# Patient Record
Sex: Female | Born: 1950 | Race: Black or African American | Hispanic: No | Smoking: Former smoker
Health system: Southern US, Community
[De-identification: ages and names within clinical notes are randomized; demographics above are authoritative.]

## PROBLEM LIST (undated history)

## (undated) DIAGNOSIS — K219 Gastro-esophageal reflux disease without esophagitis: Secondary | ICD-10-CM

## (undated) DIAGNOSIS — M1990 Unspecified osteoarthritis, unspecified site: Secondary | ICD-10-CM

## (undated) DIAGNOSIS — E785 Hyperlipidemia, unspecified: Secondary | ICD-10-CM

## (undated) DIAGNOSIS — H269 Unspecified cataract: Secondary | ICD-10-CM

## (undated) HISTORY — DX: Hyperlipidemia, unspecified: E78.5

## (undated) HISTORY — DX: Unspecified osteoarthritis, unspecified site: M19.90

## (undated) HISTORY — DX: Gastro-esophageal reflux disease without esophagitis: K21.9

## (undated) HISTORY — PX: SHOULDER ARTHROSCOPY WITH ROTATOR CUFF REPAIR AND OPEN BICEPS TENODESIS: SHX6677

## (undated) HISTORY — DX: Unspecified cataract: H26.9

## (undated) HISTORY — PX: KNEE ARTHROPLASTY: SHX992

---

## 2010-01-15 DIAGNOSIS — Z86010 Personal history of colonic polyps: Secondary | ICD-10-CM | POA: Insufficient documentation

## 2010-11-29 DIAGNOSIS — N200 Calculus of kidney: Secondary | ICD-10-CM | POA: Insufficient documentation

## 2011-05-23 DIAGNOSIS — E785 Hyperlipidemia, unspecified: Secondary | ICD-10-CM | POA: Insufficient documentation

## 2011-08-13 DIAGNOSIS — I10 Essential (primary) hypertension: Secondary | ICD-10-CM | POA: Insufficient documentation

## 2012-02-19 DIAGNOSIS — M5030 Other cervical disc degeneration, unspecified cervical region: Secondary | ICD-10-CM | POA: Insufficient documentation

## 2012-06-01 DIAGNOSIS — A6000 Herpesviral infection of urogenital system, unspecified: Secondary | ICD-10-CM | POA: Insufficient documentation

## 2013-12-02 DIAGNOSIS — K5790 Diverticulosis of intestine, part unspecified, without perforation or abscess without bleeding: Secondary | ICD-10-CM | POA: Insufficient documentation

## 2018-01-09 DIAGNOSIS — F419 Anxiety disorder, unspecified: Secondary | ICD-10-CM | POA: Insufficient documentation

## 2018-03-13 DIAGNOSIS — Z87891 Personal history of nicotine dependence: Secondary | ICD-10-CM | POA: Insufficient documentation

## 2019-05-21 DIAGNOSIS — Z87891 Personal history of nicotine dependence: Secondary | ICD-10-CM | POA: Insufficient documentation

## 2019-07-12 DIAGNOSIS — R519 Headache, unspecified: Secondary | ICD-10-CM | POA: Insufficient documentation

## 2019-12-13 DIAGNOSIS — S92423B Displaced fracture of distal phalanx of unspecified great toe, initial encounter for open fracture: Secondary | ICD-10-CM | POA: Insufficient documentation

## 2019-12-13 DIAGNOSIS — S92413A Displaced fracture of proximal phalanx of unspecified great toe, initial encounter for closed fracture: Secondary | ICD-10-CM | POA: Insufficient documentation

## 2019-12-13 DIAGNOSIS — S92919A Unspecified fracture of unspecified toe(s), initial encounter for closed fracture: Secondary | ICD-10-CM | POA: Insufficient documentation

## 2019-12-22 ENCOUNTER — Emergency Department (HOSPITAL_COMMUNITY)
Admission: EM | Admit: 2019-12-22 | Discharge: 2019-12-23 | Disposition: A | Discharge status: Home / Self Care | Payer: Medicare Other | Attending: Emergency Medicine | Admitting: Emergency Medicine

## 2019-12-22 ENCOUNTER — Other Ambulatory Visit: Payer: Self-pay

## 2019-12-22 DIAGNOSIS — R195 Other fecal abnormalities: Secondary | ICD-10-CM

## 2019-12-22 DIAGNOSIS — R109 Unspecified abdominal pain: Secondary | ICD-10-CM

## 2019-12-22 DIAGNOSIS — K921 Melena: Secondary | ICD-10-CM | POA: Insufficient documentation

## 2019-12-22 LAB — COMPREHENSIVE METABOLIC PANEL
ALT: 17 U/L (ref 0–44)
AST: 15 U/L (ref 15–41)
Albumin: 4.2 g/dL (ref 3.5–5.0)
Alkaline Phosphatase: 79 U/L (ref 38–126)
Anion gap: 8 (ref 5–15)
BUN: 14 mg/dL (ref 8–23)
CO2: 29 mmol/L (ref 22–32)
Calcium: 10.5 mg/dL — ABNORMAL HIGH (ref 8.9–10.3)
Chloride: 105 mmol/L (ref 98–111)
Creatinine, Ser: 0.83 mg/dL (ref 0.44–1.00)
GFR calc Af Amer: >60 mL/min (ref >60)
GFR calc non Af Amer: >60 mL/min (ref >60)
Glucose, Bld: 147 mg/dL — ABNORMAL HIGH (ref 70–99)
Potassium: 3.8 mmol/L (ref 3.5–5.1)
Sodium: 142 mmol/L (ref 135–145)
Total Bilirubin: 0.9 mg/dL (ref 0.3–1.2)
Total Protein: 7.1 g/dL (ref 6.5–8.1)

## 2019-12-22 LAB — CBC
HCT: 37.1 % (ref 36.0–46.0)
Hemoglobin: 11.2 g/dL — ABNORMAL LOW (ref 12.0–15.0)
MCH: 28.4 pg (ref 26.0–34.0)
MCHC: 30.2 g/dL (ref 30.0–36.0)
MCV: 93.9 fL (ref 80.0–100.0)
Platelets: 270 10*3/uL (ref 150–400)
RBC: 3.95 MIL/uL (ref 3.87–5.11)
RDW: 13.9 % (ref 11.5–15.5)
WBC: 12.2 10*3/uL — ABNORMAL HIGH (ref 4.0–10.5)
nRBC: 0 % (ref 0.0–0.2)

## 2019-12-22 LAB — TYPE AND SCREEN
ABO/RH(D): B POS
Antibody Screen: NEGATIVE
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
## 2019-12-23 DIAGNOSIS — K921 Melena: Secondary | ICD-10-CM | POA: Diagnosis not present

## 2019-12-23 LAB — POC OCCULT BLOOD, ED: Fecal Occult Bld: NEGATIVE
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
## 2020-05-08 DIAGNOSIS — M7740 Metatarsalgia, unspecified foot: Secondary | ICD-10-CM | POA: Insufficient documentation

## 2020-05-08 DIAGNOSIS — M722 Plantar fascial fibromatosis: Secondary | ICD-10-CM | POA: Insufficient documentation

## 2020-05-08 DIAGNOSIS — M2190 Unspecified acquired deformity of unspecified limb: Secondary | ICD-10-CM | POA: Insufficient documentation

## 2020-05-20 DIAGNOSIS — M5030 Other cervical disc degeneration, unspecified cervical region: Secondary | ICD-10-CM | POA: Insufficient documentation

## 2020-05-20 DIAGNOSIS — K219 Gastro-esophageal reflux disease without esophagitis: Secondary | ICD-10-CM | POA: Insufficient documentation

## 2020-05-20 DIAGNOSIS — G4700 Insomnia, unspecified: Secondary | ICD-10-CM | POA: Insufficient documentation

## 2020-05-20 DIAGNOSIS — F329 Major depressive disorder, single episode, unspecified: Secondary | ICD-10-CM | POA: Insufficient documentation

## 2020-05-20 DIAGNOSIS — M48061 Spinal stenosis, lumbar region without neurogenic claudication: Secondary | ICD-10-CM | POA: Insufficient documentation

## 2020-05-20 DIAGNOSIS — M150 Primary generalized (osteo)arthritis: Secondary | ICD-10-CM | POA: Insufficient documentation

## 2020-05-20 DIAGNOSIS — M48062 Spinal stenosis, lumbar region with neurogenic claudication: Secondary | ICD-10-CM | POA: Insufficient documentation

## 2020-07-12 DIAGNOSIS — Z96611 Presence of right artificial shoulder joint: Secondary | ICD-10-CM | POA: Insufficient documentation

## 2020-07-12 DIAGNOSIS — Z471 Aftercare following joint replacement surgery: Secondary | ICD-10-CM | POA: Insufficient documentation

## 2020-07-20 ENCOUNTER — Other Ambulatory Visit: Payer: Self-pay | Admitting: Internal Medicine

## 2020-07-20 DIAGNOSIS — M7989 Other specified soft tissue disorders: Secondary | ICD-10-CM

## 2020-07-20 DIAGNOSIS — R1907 Generalized intra-abdominal and pelvic swelling, mass and lump: Secondary | ICD-10-CM

## 2020-07-25 ENCOUNTER — Other Ambulatory Visit: Payer: Self-pay

## 2020-07-25 ENCOUNTER — Ambulatory Visit
Admission: RE | Admit: 2020-07-25 | Discharge: 2020-07-25 | Disposition: A | Discharge status: Home / Self Care | Payer: Medicare Other | Source: Ambulatory Visit | Attending: Internal Medicine | Admitting: Internal Medicine

## 2020-07-25 DIAGNOSIS — M7989 Other specified soft tissue disorders: Secondary | ICD-10-CM | POA: Diagnosis present

## 2020-07-25 DIAGNOSIS — R1907 Generalized intra-abdominal and pelvic swelling, mass and lump: Secondary | ICD-10-CM | POA: Insufficient documentation

## 2020-09-11 ENCOUNTER — Other Ambulatory Visit: Payer: Self-pay | Admitting: Pain Medicine

## 2020-09-11 DIAGNOSIS — G90511 Complex regional pain syndrome I of right upper limb: Secondary | ICD-10-CM

## 2020-09-21 ENCOUNTER — Encounter
Admission: RE | Admit: 2020-09-21 | Discharge: 2020-09-21 | Disposition: A | Discharge status: Home / Self Care | Payer: Medicare Other | Source: Ambulatory Visit | Attending: Pain Medicine | Admitting: Pain Medicine

## 2020-09-21 ENCOUNTER — Ambulatory Visit
Admission: RE | Admit: 2020-09-21 | Discharge: 2020-09-21 | Disposition: A | Discharge status: Home / Self Care | Payer: Medicare Other | Source: Ambulatory Visit | Attending: Pain Medicine | Admitting: Pain Medicine

## 2020-09-21 ENCOUNTER — Other Ambulatory Visit: Payer: Self-pay

## 2020-09-21 DIAGNOSIS — G90511 Complex regional pain syndrome I of right upper limb: Secondary | ICD-10-CM | POA: Diagnosis present

## 2020-09-21 MED ORDER — TECHNETIUM TC 99M MEDRONATE IV KIT
20.0000 | PACK | Freq: Once | INTRAVENOUS | Status: AC | PRN
  Administered 2020-09-21: 22.33 via INTRAVENOUS

## 2020-09-22 DIAGNOSIS — G8929 Other chronic pain: Secondary | ICD-10-CM | POA: Insufficient documentation

## 2020-09-22 DIAGNOSIS — M25511 Pain in right shoulder: Secondary | ICD-10-CM | POA: Insufficient documentation

## 2020-10-09 DIAGNOSIS — S9031XA Contusion of right foot, initial encounter: Secondary | ICD-10-CM | POA: Insufficient documentation

## 2020-12-14 ENCOUNTER — Other Ambulatory Visit: Payer: Self-pay | Admitting: Orthopedic Surgery

## 2020-12-14 DIAGNOSIS — M25511 Pain in right shoulder: Secondary | ICD-10-CM

## 2021-01-02 ENCOUNTER — Other Ambulatory Visit: Payer: Self-pay

## 2021-01-02 ENCOUNTER — Ambulatory Visit
Admission: RE | Admit: 2021-01-02 | Discharge: 2021-01-02 | Disposition: A | Discharge status: Home / Self Care | Payer: Medicare Other | Source: Ambulatory Visit | Attending: Orthopedic Surgery | Admitting: Orthopedic Surgery

## 2021-01-02 DIAGNOSIS — D649 Anemia, unspecified: Secondary | ICD-10-CM | POA: Insufficient documentation

## 2021-01-02 DIAGNOSIS — M25511 Pain in right shoulder: Secondary | ICD-10-CM | POA: Diagnosis not present

## 2021-01-02 DIAGNOSIS — F1090 Alcohol use, unspecified, uncomplicated: Secondary | ICD-10-CM | POA: Insufficient documentation

## 2021-01-02 DIAGNOSIS — F329 Major depressive disorder, single episode, unspecified: Secondary | ICD-10-CM | POA: Insufficient documentation

## 2021-01-02 DIAGNOSIS — M545 Low back pain, unspecified: Secondary | ICD-10-CM | POA: Insufficient documentation

## 2021-01-02 DIAGNOSIS — Z96619 Presence of unspecified artificial shoulder joint: Secondary | ICD-10-CM | POA: Insufficient documentation

## 2021-01-02 DIAGNOSIS — M791 Myalgia, unspecified site: Secondary | ICD-10-CM | POA: Insufficient documentation

## 2021-01-02 DIAGNOSIS — F32A Depression, unspecified: Secondary | ICD-10-CM | POA: Insufficient documentation

## 2021-01-02 DIAGNOSIS — Z7289 Other problems related to lifestyle: Secondary | ICD-10-CM | POA: Insufficient documentation

## 2021-01-02 DIAGNOSIS — M12811 Other specific arthropathies, not elsewhere classified, right shoulder: Secondary | ICD-10-CM | POA: Insufficient documentation

## 2021-01-02 DIAGNOSIS — M75120 Complete rotator cuff tear or rupture of unspecified shoulder, not specified as traumatic: Secondary | ICD-10-CM | POA: Insufficient documentation

## 2021-01-02 DIAGNOSIS — M5430 Sciatica, unspecified side: Secondary | ICD-10-CM | POA: Insufficient documentation

## 2021-01-02 DIAGNOSIS — R7303 Prediabetes: Secondary | ICD-10-CM | POA: Insufficient documentation

## 2021-01-02 DIAGNOSIS — M25519 Pain in unspecified shoulder: Secondary | ICD-10-CM | POA: Insufficient documentation

## 2021-01-02 DIAGNOSIS — G8929 Other chronic pain: Secondary | ICD-10-CM | POA: Insufficient documentation

## 2021-01-09 ENCOUNTER — Other Ambulatory Visit: Payer: Self-pay | Admitting: Orthopedic Surgery

## 2021-01-09 DIAGNOSIS — M25519 Pain in unspecified shoulder: Secondary | ICD-10-CM

## 2021-02-05 ENCOUNTER — Other Ambulatory Visit: Payer: Self-pay

## 2021-02-05 ENCOUNTER — Encounter: Payer: Self-pay | Admitting: Student in an Organized Health Care Education/Training Program

## 2021-02-05 ENCOUNTER — Ambulatory Visit
Payer: Medicare Other | Attending: Student in an Organized Health Care Education/Training Program | Admitting: Student in an Organized Health Care Education/Training Program

## 2021-02-05 VITALS — BP 102/71 | HR 81 | Temp 97.0°F | Resp 16 | Ht 65.0 in | Wt 156.0 lb

## 2021-02-05 DIAGNOSIS — G5641 Causalgia of right upper limb: Secondary | ICD-10-CM | POA: Insufficient documentation

## 2021-02-05 DIAGNOSIS — G5690 Unspecified mononeuropathy of unspecified upper limb: Secondary | ICD-10-CM | POA: Insufficient documentation

## 2021-02-05 DIAGNOSIS — Z96611 Presence of right artificial shoulder joint: Secondary | ICD-10-CM | POA: Insufficient documentation

## 2021-02-05 DIAGNOSIS — G894 Chronic pain syndrome: Secondary | ICD-10-CM | POA: Diagnosis present

## 2021-02-05 DIAGNOSIS — M792 Neuralgia and neuritis, unspecified: Secondary | ICD-10-CM | POA: Diagnosis not present

## 2021-02-05 DIAGNOSIS — G5681 Other specified mononeuropathies of right upper limb: Secondary | ICD-10-CM | POA: Insufficient documentation
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
## 2021-02-05 NOTE — Progress Notes (Signed)
Safety precautions to be maintained throughout the outpatient stay will include: orient to surroundings, keep bed in low position, maintain call bell within reach at all times, provide assistance with transfer out of bed and ambulation.  
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
## 2021-02-09 LAB — COMPLIANCE DRUG ANALYSIS, UR

## 2021-02-12 NOTE — Progress Notes (Signed)
PROVIDER NOTE: Information contained herein reflects review and annotations entered in Brennan with encounter. Interpretation of such information and data should be left to medically-trained personnel. Information provided to patient can be located elsewhere in the medical record under "Patient Instructions". Document created using STT-dictation technology, any transcriptional errors that may result from process are unintentional.    Patient: Claudia Brennan  Service Category: Procedure  Provider: Oswaldo Done, MD  DOB: [birth year]  DOS: 02/13/2021  Location: [facility]  MRN: [id number]  Setting: Ambulatory - outpatient  Referring Provider: Roxana Hires, MD  Type: Established Patient  Specialty: Interventional Pain Management  PCP: Claudia Abrahams, MD   Primary Reason for Visit: Interventional Pain Management Treatment. CC: Shoulder Pain (right)    Procedure:          Anesthesia, Analgesia, Anxiolysis:  Type: Diagnostic Suprascapular nerve Block #1  Primary Purpose: Diagnostic Region: Posterior Shoulder & Scapular Areas Level: Superior to the scapular spine, in the lateral aspect of the supraspinatus fossa (Suprescapular notch). Target Area: Suprascapular nerve as it passes thru the lower portion of the suprascapular notch. Approach: Posterior percutaneous approach. Laterality: Right-Side  Type: Local Anesthesia Local Anesthetic: Lidocaine 1-2% Sedation: Oral Anxiolysis (Claudia Brennan) Indication(s): Anxiety & Analgesia Route: Infiltration (Claudia Brennan) IV Access: Available   Position: Prone   Indications: 1. Chronic shoulder pain (Right)   2. Complex regional pain syndrome type 2 of right upper extremity   3. Neuropathic pain of shoulder (Right)   4. Rotator cuff arthropathy of shoulder (Right)   5. Presence of artificial shoulder joint (Right)   6. S/P reverse arthroplasty of shoulder (Right)   7. Complex regional pain syndrome type 1 affecting shoulder  (Right)   8. Pain in shoulder region after shoulder replacement (Right)   9. Abnormal CT scan, cervical spine (Claudia Brennan) (11/26/2019)    Pain Score: Pre-procedure: 5 /10 Post-procedure: 7 /10     The patient was referred to Dr. Edward Brennan for treatment of a possible right shoulder complex regional pain syndrome type I status post shoulder replacement.  The patient had a single stellate ganglion block with documented benefit.  Upon examination of the patient the patient reveals shoulder pain that seems to be more localized towards the anterior aspect of the shoulder.  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  In addition the CT scan is also significant for a C3-4 anterolisthesis and multilevel bilateral cervical facet arthropathy, all of which could refer pain towards the shoulder region and may also temporarily respond to a right-sided stellate ganglion block with enough volume to spread over the affected areas.  Today the patient will undergo a diagnostic suprascapular nerve block to determine if her condition may be amenable/responsive to a right-sided suprascapular nerve radiofrequency ablation.  Pre-op H&P Assessment:  Claudia Brennan is a 70 y.o. (year old), female patient, seen today for interventional treatment. She  has a past surgical history that includes Knee Arthroplasty and Shoulder arthroscopy with rotator cuff repair and open biceps tenodesis (Bilateral). Claudia Brennan has a current medication list which includes the following prescription(s): atorvastatin, citalopram, hydrochlorothiazide, niacin, pantoprazole, pregabalin, topiramate, and trazodone, and the following Facility-Administered Medications: pentafluoroprop-tetrafluoroeth. Her primarily concern today is the Shoulder Pain (right)  Initial Vital Signs:  Pulse/HCG Rate: 63ECG Heart Rate: 67 Temp: (!) 97.1 F (36.2 C) Resp: 16 BP:  108/70 SpO2: 98 %  BMI: Estimated body mass index is 25.96  kg/m as calculated from the following:   Height as of this encounter: 5\' 5"  (1.651 m).   Weight as of this encounter: 156 lb (70.8 kg).  Risk Assessment: Allergies: Reviewed. She is allergic to penicillins.  Allergy Precautions: None required Coagulopathies: Reviewed. None identified.  Blood-thinner therapy: None at this time Active Infection(s): Reviewed. None identified. Claudia Brennan is afebrile  Site Confirmation: Claudia Brennan was asked to confirm the procedure and laterality before marking the site Procedure checklist: Completed Consent: Before the procedure and under the influence of no sedative(s), amnesic(s), or anxiolytics, the patient was informed of the treatment options, risks and possible complications. To fulfill our ethical and legal obligations, as recommended by the Claudia Brennan's Code of Ethics, I have informed the patient of my clinical impression; the nature and purpose of the treatment or procedure; the risks, benefits, and possible complications of the intervention; the alternatives, including doing nothing; the risk(s) and benefit(s) of the alternative treatment(s) or procedure(s); and the risk(s) and benefit(s) of doing nothing. The patient was provided information about the general risks and possible complications associated with the procedure. These may include, but are not limited to: failure to achieve desired goals, infection, bleeding, organ or nerve damage, allergic reactions, paralysis, and death. In addition, the patient was informed of those risks and complications associated to the procedure, such as failure to decrease pain; infection; bleeding; organ or nerve damage with subsequent damage to sensory, motor, and/or autonomic systems, resulting in permanent pain, numbness, and/or weakness of one or several areas of the body; allergic reactions; (i.e.: anaphylactic reaction); and/or  death. Furthermore, the patient was informed of those risks and complications associated with the medications. These include, but are not limited to: allergic reactions (i.e.: anaphylactic or anaphylactoid reaction(s)); adrenal axis suppression; blood sugar elevation that in diabetics may result in ketoacidosis or comma; water retention that in patients with history of congestive heart failure may result in shortness of breath, pulmonary edema, and decompensation with resultant heart failure; weight gain; swelling or edema; medication-induced neural toxicity; particulate matter embolism and blood vessel occlusion with resultant organ, and/or nervous system infarction; and/or aseptic necrosis of one or more joints. Finally, the patient was informed that Medicine is not an exact science; therefore, there is also the possibility of unforeseen or unpredictable risks and/or possible complications that may result in a catastrophic outcome. The patient indicated having understood very clearly. We have given the patient no guarantees and we have made no promises. Enough time was given to the patient to ask questions, all of which were answered to the patient's satisfaction. Ms. Langham has indicated that she wanted to continue with the procedure. Attestation: I, the ordering provider, attest that I have discussed with the patient the benefits, risks, side-effects, alternatives, likelihood of achieving goals, and potential problems during recovery for the procedure that I have provided informed consent. Date  Time: 02/13/2021  8:05 AM  Pre-Procedure Preparation:  Monitoring: As per clinic protocol. Respiration, ETCO2, SpO2, BP, heart rate and rhythm monitor placed and checked for adequate function Safety Precautions: Patient was assessed for positional comfort and pressure points before starting the procedure. Time-out: I initiated and conducted the "Time-out" before starting the procedure, as per protocol. The  patient was asked to participate by confirming the accuracy of the "Time Out" information. Verification of the correct person, site, and procedure were performed and confirmed by me, the nursing staff, and the patient. "Time-out" conducted as per Joint Commission's Universal Protocol (UP.01.01.01). Time: [id number]  Description of Procedure:          Area Prepped: Entire shoulder Area DuraPrep (Iodine Povacrylex [0.7% available iodine] and Isopropyl Alcohol, 74% w/w) Safety Precautions: Aspiration looking for blood return was conducted prior to all injections. At no point did we inject any substances, as a needle was being advanced. No attempts were made at seeking any paresthesias. Safe injection practices and needle disposal techniques used. Medications properly checked for expiration dates. SDV (single dose vial) medications used. Description of the Procedure: Protocol guidelines were followed. The patient was placed in position over the procedure table. The target area was identified and the area prepped in the usual manner. Skin & deeper tissues infiltrated with local anesthetic. Appropriate amount of time allowed to pass for local anesthetics to take effect. The procedure needles were then advanced to the target area. Proper needle placement secured. Negative aspiration confirmed. Solution injected in intermittent fashion, asking for systemic symptoms every 0.5cc of injectate. The needles were then removed and the area cleansed, making sure to leave some of the prepping solution back to take advantage of its long term bactericidal properties.  Vitals:   02/13/21 0804 02/13/21 0840 02/13/21 0845 02/13/21 0849  BP: 108/70 133/60 100/61 (!) 111/59  Pulse: 63     Resp: 16 15 14 14   Temp: (!) 97.1 F (36.2 C)     TempSrc: Temporal     SpO2: 98% 99% 100% 100%  Weight: 156 lb (70.8 kg)     Height: 5\' 5"  (1.651 m)       Start Time: 0839 hrs. End Time: 0848 hrs. Materials:  Needle(s) Type: Spinal  Needle Gauge: 22G Length: 3.5-in Medication(s): Please see orders for medications and dosing details.  Imaging Guidance (Non-Spinal):          Type of Imaging Technique: Fluoroscopy Guidance (Non-Spinal) Indication(s): Assistance in needle guidance and placement for procedures requiring needle placement in or near specific anatomical locations not easily accessible without such assistance. Exposure Time: Please see nurses notes. Contrast: Before injecting any contrast, we confirmed that the patient did not have an allergy to iodine, shellfish, or radiological contrast. Once satisfactory needle placement was completed at the desired level, radiological contrast was injected. Contrast injected under live fluoroscopy. No contrast complications. See chart for type and volume of contrast used. Fluoroscopic Guidance: I was personally present during the use of fluoroscopy. "Tunnel Vision Technique" used to obtain the best possible view of the target area. Parallax error corrected before commencing the procedure. "Direction-depth-direction" technique used to introduce the needle under continuous pulsed fluoroscopy. Once target was reached, antero-posterior, oblique, and lateral fluoroscopic projection used confirm needle placement in all planes. Images permanently stored in EMR. Interpretation: I personally interpreted the imaging intraoperatively. Adequate needle placement confirmed in multiple planes. Appropriate spread of contrast into desired area was observed. No evidence of afferent or efferent intravascular uptake. Permanent images saved into the patient's record.  Antibiotic Prophylaxis:   Anti-infectives (From admission, onward)    None      Indication(s): None identified  Post-operative Assessment:  Post-procedure Vital Signs:  Pulse/HCG Rate: 6360 Temp: (!) 97.1 F (36.2 C) Resp: 14 BP: (!) 111/59 SpO2: 100 %  EBL: None  Complications: No immediate post-treatment complications  observed by team, or reported by patient.  Note: The patient tolerated the entire procedure well. A repeat set of vitals were taken after the procedure and the patient was kept under observation following institutional policy, for this type of procedure. Post-procedural neurological assessment  was performed, showing return to baseline, prior to discharge. The patient was provided with post-procedure discharge instructions, including a section on how to identify potential problems. Should any problems arise concerning this procedure, the patient was given instructions to immediately contact us, at any time, without hesitation. In any case, we plan to contact the patient by telephone for a follow-up status report regarding this interventional procedure.  Comments:  No additional relevant information.  Plan of Care  Orders:  Orders Placed This Encounter  Procedures   SUPRASCAPULAR NERVE BLOCK    For shoulder pain.    Scheduling Instructions:     Purpose: Diagnostic     Laterality: Right-sided     Level(s): Suprascapular notch     Sedation: No Sedation.     Scheduling Timeframe: Today    Order Specific Question:   Where will this procedure be performed?    Answer:   [facility]   DG PAIN [facility] C-ARM 1-60 MIN NO REPORT    Intraoperative interpretation by procedural physician at [facility].    Standing Status:   Standing    Number of Occurrences:   1    Order Specific Question:   Reason for exam:    Answer:   Assistance in needle guidance and placement for procedures requiring needle placement in or near specific anatomical locations not easily accessible without such assistance.   Informed Consent Details: Physician/Practitioner Attestation; Transcribe to consent form and obtain patient signature    Note: Always confirm laterality of pain with Ms. Rainey, before procedure.    Order Specific Question:   Physician/Practitioner attestation of informed consent for  procedure/surgical case    Answer:   I, the physician/practitioner, attest that I have discussed with the patient the benefits, risks, side effects, alternatives, likelihood of achieving goals and potential problems during recovery for the procedure that I have provided informed consent.    Order Specific Question:   Procedure    Answer:   Suprascapular Nerve Block    Order Specific Question:   Physician/Practitioner performing the procedure    Answer:   Perian Tedder A. Laban Emperor, MD    Order Specific Question:   Indication/Reason    Answer:   Chronic shoulder pain (arthralgia)   Provide equipment / supplies at bedside    "Block Tray" (Disposable  single use) Needle type: SpinalSpinal Amount/quantity: 1 Size: Regular (3.5-inch) Gauge: 22G    Standing Status:   Standing    Number of Occurrences:   1    Order Specific Question:   Specify    Answer:   Block Tray    Chronic Opioid Analgesic:  No chronic opioid analgesics therapy prescribed by our practice.   Medications ordered for procedure: Meds ordered this encounter  Medications   lidocaine (XYLOCAINE) 2 % (with pres) injection 400 mg   pentafluoroprop-tetrafluoroeth (GEBAUERS) aerosol   ropivacaine (PF) 2 mg/mL (0.2%) (NAROPIN) injection 4 mL   methylPREDNISolone acetate (DEPO-MEDROL) injection 80 mg   diazepam (Claudia Brennan) tablet 5 mg    Make sure Flumazenil is available in the pyxis when using this medication. If oversedation occurs, administer 0.2 mg IV over 15 sec. If after 45 sec no response, administer 0.2 mg again over 1 min; may repeat at 1 min intervals; not to exceed 4 doses (1 mg)    Medications administered: We administered lidocaine, ropivacaine (PF) 2 mg/mL (0.2%), methylPREDNISolone acetate, and diazepam.  See the medical record for exact dosing, route, and time of administration.  Follow-up plan:  Return in about 2 weeks (around 02/27/2021) for (F2F), (MM) with Dr. Edward Brennan.        Interventional Therapies   Risk  Complexity Considerations:   Estimated body mass index is 25.96 kg/m as calculated from the following:   Height as of this encounter: 5\' 5"  (1.651 m).   Weight as of this encounter: 156 lb (70.8 kg). WNL   Planned  Pending:   Diagnostic right suprascapular NB #1 (02/13/2021)    Under consideration:   Possible right suprascapular nerve RFA  Diagnostic right stellate ganglion block  Possible right posterior thoracic sympathectomy under CT guidance  Diagnostic right cervical facet MBB  Possible right cervical facet RFA  Diagnostic right cervical ESI    Completed:   None at this time   Therapeutic  Palliative (PRN) options:   None established    Recent Visits Date Type Provider Dept  02/05/21 Office Visit 02/07/21, MD [facility]  Showing recent visits within past 90 days and meeting all other requirements Today's Visits Date Type Provider Dept  02/13/21 Procedure visit 02/15/21, MD [facility]  Showing today's visits and meeting all other requirements Future Appointments Date Type Provider Dept  03/01/21 Appointment 03/03/21, MD [facility]  Showing future appointments within next 90 days and meeting all other requirements Disposition: Discharge home  Discharge (Date  Time): 02/13/2021; 0900 hrs.   Primary Care Physician: 02/15/2021, Annia Belt, MD Location: [facility] Note by: [facility], MD Date: 02/13/2021; Time: 11:37 AM  Disclaimer:  Medicine is not an exact science. The only guarantee in medicine is that nothing is guaranteed. It is important to note that the decision to proceed with this intervention was based on the information collected from the patient. The Data and conclusions were drawn from the patient's questionnaire, the interview, and the physical examination. Because the information was provided in large part by the patient, it cannot be guaranteed that it has not  been purposely or unconsciously manipulated. Every effort has been made to obtain as much relevant data as possible for this evaluation. It is important to note that the conclusions that lead to this procedure are derived in large part from the available data. Always take into account that the treatment will also be dependent on availability of resources and existing treatment guidelines, considered by other Pain Management Practitioners as being common knowledge and practice, at the time of the intervention. For Medico-Legal purposes, it is also important to point out that variation in procedural techniques and pharmacological choices are the acceptable norm. The indications, contraindications, technique, and results of the above procedure should only be interpreted and judged by a Board-Certified Interventional Pain Specialist with extensive familiarity and expertise in the same exact procedure and technique.

## 2021-02-13 ENCOUNTER — Ambulatory Visit
Admission: RE | Admit: 2021-02-13 | Discharge: 2021-02-13 | Disposition: A | Discharge status: Home / Self Care | Payer: Medicare Other | Source: Ambulatory Visit | Attending: Pain Medicine | Admitting: Pain Medicine

## 2021-02-13 ENCOUNTER — Other Ambulatory Visit: Payer: Self-pay

## 2021-02-13 ENCOUNTER — Ambulatory Visit (HOSPITAL_BASED_OUTPATIENT_CLINIC_OR_DEPARTMENT_OTHER): Payer: Medicare Other | Admitting: Pain Medicine

## 2021-02-13 ENCOUNTER — Encounter: Payer: Self-pay | Admitting: Pain Medicine

## 2021-02-13 VITALS — BP 111/59 | HR 63 | Temp 97.1°F | Resp 14 | Ht 65.0 in | Wt 156.0 lb

## 2021-02-13 DIAGNOSIS — M25519 Pain in unspecified shoulder: Secondary | ICD-10-CM | POA: Diagnosis present

## 2021-02-13 DIAGNOSIS — M25619 Stiffness of unspecified shoulder, not elsewhere classified: Secondary | ICD-10-CM | POA: Insufficient documentation

## 2021-02-13 DIAGNOSIS — M1710 Unilateral primary osteoarthritis, unspecified knee: Secondary | ICD-10-CM | POA: Insufficient documentation

## 2021-02-13 DIAGNOSIS — F419 Anxiety disorder, unspecified: Secondary | ICD-10-CM

## 2021-02-13 DIAGNOSIS — Z96619 Presence of unspecified artificial shoulder joint: Secondary | ICD-10-CM

## 2021-02-13 DIAGNOSIS — M5412 Radiculopathy, cervical region: Secondary | ICD-10-CM | POA: Insufficient documentation

## 2021-02-13 DIAGNOSIS — Z96611 Presence of right artificial shoulder joint: Secondary | ICD-10-CM | POA: Insufficient documentation

## 2021-02-13 DIAGNOSIS — M12811 Other specific arthropathies, not elsewhere classified, right shoulder: Secondary | ICD-10-CM

## 2021-02-13 DIAGNOSIS — M19049 Primary osteoarthritis, unspecified hand: Secondary | ICD-10-CM | POA: Insufficient documentation

## 2021-02-13 DIAGNOSIS — M47812 Spondylosis without myelopathy or radiculopathy, cervical region: Secondary | ICD-10-CM | POA: Insufficient documentation

## 2021-02-13 DIAGNOSIS — M19011 Primary osteoarthritis, right shoulder: Secondary | ICD-10-CM | POA: Insufficient documentation

## 2021-02-13 DIAGNOSIS — M25511 Pain in right shoulder: Secondary | ICD-10-CM

## 2021-02-13 DIAGNOSIS — G90511 Complex regional pain syndrome I of right upper limb: Secondary | ICD-10-CM | POA: Diagnosis present

## 2021-02-13 DIAGNOSIS — G8929 Other chronic pain: Secondary | ICD-10-CM | POA: Diagnosis not present

## 2021-02-13 DIAGNOSIS — M719 Bursopathy, unspecified: Secondary | ICD-10-CM | POA: Insufficient documentation

## 2021-02-13 DIAGNOSIS — M4314 Spondylolisthesis, thoracic region: Secondary | ICD-10-CM | POA: Insufficient documentation

## 2021-02-13 DIAGNOSIS — M546 Pain in thoracic spine: Secondary | ICD-10-CM | POA: Insufficient documentation

## 2021-02-13 DIAGNOSIS — G5641 Causalgia of right upper limb: Secondary | ICD-10-CM | POA: Insufficient documentation

## 2021-02-13 DIAGNOSIS — F17200 Nicotine dependence, unspecified, uncomplicated: Secondary | ICD-10-CM | POA: Insufficient documentation

## 2021-02-13 DIAGNOSIS — M179 Osteoarthritis of knee, unspecified: Secondary | ICD-10-CM | POA: Insufficient documentation

## 2021-02-13 DIAGNOSIS — R937 Abnormal findings on diagnostic imaging of other parts of musculoskeletal system: Secondary | ICD-10-CM | POA: Insufficient documentation

## 2021-02-13 DIAGNOSIS — M792 Neuralgia and neuritis, unspecified: Secondary | ICD-10-CM | POA: Diagnosis present

## 2021-02-13 DIAGNOSIS — M4802 Spinal stenosis, cervical region: Secondary | ICD-10-CM | POA: Insufficient documentation

## 2021-02-13 DIAGNOSIS — M6281 Muscle weakness (generalized): Secondary | ICD-10-CM | POA: Insufficient documentation

## 2021-02-13 DIAGNOSIS — M542 Cervicalgia: Secondary | ICD-10-CM | POA: Insufficient documentation

## 2021-02-13 DIAGNOSIS — M4312 Spondylolisthesis, cervical region: Secondary | ICD-10-CM | POA: Insufficient documentation

## 2021-02-13 DIAGNOSIS — M7530 Calcific tendinitis of unspecified shoulder: Secondary | ICD-10-CM | POA: Insufficient documentation

## 2021-02-13 DIAGNOSIS — S239XXA Sprain of unspecified parts of thorax, initial encounter: Secondary | ICD-10-CM | POA: Insufficient documentation

## 2021-02-13 MED ORDER — ROPIVACAINE HCL 2 MG/ML IJ SOLN
INTRAMUSCULAR | Status: AC
  Filled 2021-02-13: qty 20

## 2021-02-13 MED ORDER — DIAZEPAM 5 MG PO TABS
ORAL_TABLET | ORAL | Status: AC
  Filled 2021-02-13: qty 1

## 2021-02-13 MED ORDER — METHYLPREDNISOLONE ACETATE 80 MG/ML IJ SUSP
INTRAMUSCULAR | Status: AC
  Filled 2021-02-13: qty 1

## 2021-02-13 MED ORDER — ROPIVACAINE HCL 2 MG/ML IJ SOLN
4.0000 mL | Freq: Once | INTRAMUSCULAR | Status: AC
  Administered 2021-02-13: 20 mL via PERINEURAL

## 2021-02-13 MED ORDER — LIDOCAINE HCL 2 % IJ SOLN
INTRAMUSCULAR | Status: AC
  Filled 2021-02-13: qty 20

## 2021-02-13 MED ORDER — DIAZEPAM 5 MG PO TABS
5.0000 mg | ORAL_TABLET | ORAL | Status: AC
  Administered 2021-02-13: 5 mg via ORAL

## 2021-02-13 MED ORDER — PENTAFLUOROPROP-TETRAFLUOROETH EX AERO
INHALATION_SPRAY | Freq: Once | CUTANEOUS | Status: DC
  Filled 2021-02-13: qty 116

## 2021-02-13 MED ORDER — METHYLPREDNISOLONE ACETATE 80 MG/ML IJ SUSP
80.0000 mg | Freq: Once | INTRAMUSCULAR | Status: AC
  Administered 2021-02-13: 80 mg

## 2021-02-13 MED ORDER — LIDOCAINE HCL 2 % IJ SOLN
20.0000 mL | Freq: Once | INTRAMUSCULAR | Status: AC
  Administered 2021-02-13: 400 mg

## 2021-02-13 NOTE — Patient Instructions (Signed)

## 2021-02-13 NOTE — Progress Notes (Signed)
Safety precautions to be maintained throughout the outpatient stay will include: orient to surroundings, keep bed in low position, maintain call bell within reach at all times, provide assistance with transfer out of bed and ambulation.  

## 2021-02-14 ENCOUNTER — Telehealth: Payer: Self-pay

## 2021-02-14 NOTE — Telephone Encounter (Signed)
Post procedure phone call.  LM 

## 2021-02-28 ENCOUNTER — Other Ambulatory Visit: Payer: Self-pay | Admitting: Neurology

## 2021-02-28 ENCOUNTER — Other Ambulatory Visit (HOSPITAL_COMMUNITY): Payer: Self-pay | Admitting: Neurology

## 2021-02-28 DIAGNOSIS — M545 Low back pain, unspecified: Secondary | ICD-10-CM

## 2021-03-01 ENCOUNTER — Other Ambulatory Visit: Payer: Self-pay

## 2021-03-01 ENCOUNTER — Ambulatory Visit
Payer: Medicare Other | Attending: Student in an Organized Health Care Education/Training Program | Admitting: Student in an Organized Health Care Education/Training Program

## 2021-03-01 ENCOUNTER — Encounter: Payer: Self-pay | Admitting: Student in an Organized Health Care Education/Training Program

## 2021-03-01 VITALS — BP 152/72 | HR 75 | Temp 96.8°F | Resp 16 | Ht 65.0 in | Wt 154.0 lb

## 2021-03-01 DIAGNOSIS — Z96611 Presence of right artificial shoulder joint: Secondary | ICD-10-CM | POA: Insufficient documentation

## 2021-03-01 DIAGNOSIS — G8929 Other chronic pain: Secondary | ICD-10-CM | POA: Insufficient documentation

## 2021-03-01 DIAGNOSIS — M12811 Other specific arthropathies, not elsewhere classified, right shoulder: Secondary | ICD-10-CM | POA: Diagnosis present

## 2021-03-01 DIAGNOSIS — M792 Neuralgia and neuritis, unspecified: Secondary | ICD-10-CM | POA: Insufficient documentation

## 2021-03-01 DIAGNOSIS — M25511 Pain in right shoulder: Secondary | ICD-10-CM | POA: Diagnosis present

## 2021-03-01 NOTE — Progress Notes (Signed)
PROVIDER NOTE: Information contained herein reflects review and annotations entered in association with encounter. Interpretation of such information and data should be left to medically-trained personnel. Information provided to patient can be located elsewhere in the medical record under "Patient Instructions". Document created using STT-dictation technology, any transcriptional errors that may result from process are unintentional.    Patient: Claudia Brennan  Service Category: E/M  Provider: Gillis Santa, MD  DOB: 1950-10-08  DOS: 03/01/2021  Specialty: Interventional Pain Management  MRN: [id number]  Setting: Ambulatory outpatient  PCP: Irish Elders, MD  Type: Established Patient    Referring Provider: Irish Elders, MD  Location: Office  Delivery: Face-to-face     HPI  Ms. Shiann Kam, a 70 y.o. year old female has Complex regional pain syndrome type 2 of right upper extremity; Neuropathy, axillary nerve; Neuropathic pain of shoulder (Right); S/P reverse arthroplasty of shoulder (Right); Chronic pain syndrome; Aftercare following joint replacement surgery; Anemia; Bilateral chronic knee pain; Closed fracture of proximal phalanx of great toe; DDD (degenerative disc disease), cervical; Disorder of bursae of shoulder region; HTN (hypertension); Gastroesophageal reflux disease; Hyperlipidemia; Insomnia; Major depressive disorder, single episode, unspecified; Osteoarthritis of right shoulder; Other cervical disc degeneration, unspecified cervical region; Personal history of nicotine dependence; Presence of artificial shoulder joint (Right); Primary generalized (osteo)arthritis; Stiffness of shoulder joint; Acquired deformity of limb; Alcohol intake above recommended sensible limits; Anxiety disorder; Chronic low back pain; Closed fracture of phalanx of foot; Contusion of right foot; Depressive disorder; Diverticulosis; Full thickness rotator cuff tear; Genital herpes; H/O adenomatous polyp of  colon; Pain in shoulder region after shoulder replacement (Right); Headache, unspecified headache type; Neck pain; Hx of smoking; Kidney stone; Localized, primary osteoarthritis of hand; Metatarsalgia; Muscle pain; Muscle weakness; Open fracture of distal phalanx of great toe; Osteoarthritis of knee; Pain in thoracic spine; Calcific tendinitis of shoulder; Plantar fasciitis of right foot; Prediabetes; Rotator cuff arthropathy of shoulder (Right); Sciatica; Smoker; Spinal stenosis of lumbar region; Thoracic back sprain; Chronic shoulder pain (Right); Complex regional pain syndrome type 1 affecting shoulder (Right); Abnormal CT scan, cervical spine ([facility]) (11/26/2019); Grade 1 Anterolisthesis of cervical spine C3/C4 and C7/T1; Grade 1 Anterolisthesis of thoracic spine of T2/T3; Cervical facet arthropathy (Multilevel) (Bilateral); Radicular pain of shoulder (Right); and 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 , is here today because of her Neuropathic pain of right shoulder [M79.2]. Ms. Ladouceur primary complain today is Shoulder Pain (right) Last encounter: My last encounter with her was on 02/05/2021. Pain Assessment: Severity of Chronic pain is reported as a 0-No pain/10. Location: Shoulder Right/ . Onset:  . Quality:  (While pain is 95% better than pre-procedure, c/o" right shoulder feeling heavy and sensation of pin pricks at scar area"). Timing:  . Modifying factor(s):  Marland Kitchen Vitals:  height is _0  (1.651 m) and weight is 154 lb (69.9 kg). Her temporal temperature is 96.8 F (36 C) (abnormal). Her blood pressure is 152/72 (abnormal) and her pulse is 75. Her respiration is 16 and oxygen saturation is 100%.   Reason for encounter: post-procedure assessment.    Post-Procedure Evaluation  Procedure RIGHT SSNB 02/13/21  Anxiolysis: Please see nurses note.  Effectiveness during initial hour after procedure (Ultra-Short Term Relief):  (doesn't remember)    Local anesthetic used: Long-acting (4-6 hours) Effectiveness: Defined as any analgesic benefit obtained secondary to the administration of local anesthetics. This carries significant diagnostic value as to the etiological location, or anatomical origin, of the pain.  Duration of benefit is expected to coincide with the duration of the local anesthetic used.  Effectiveness during initial 4-6 hours after procedure (Short-Term Relief): 100%  Long-term benefit: Defined as any relief past the pharmacologic duration of the local anesthetics.  Effectiveness past the initial 6 hours after procedure (Long-Term Relief): 95 %   Benefits, current: Defined as benefit present at the time of this evaluation.   Analgesia:  90-100% Function: Somewhat improved ROM: Ms. Diep reports improvement in ROM   ROS  Constitutional: Denies any fever or chills Gastrointestinal: No reported hemesis, hematochezia, vomiting, or acute GI distress Musculoskeletal: Denies any acute onset joint swelling, redness, loss of ROM, or weakness Neurological: No reported episodes of acute onset apraxia, aphasia, dysarthria, agnosia, amnesia, paralysis, loss of coordination, or loss of consciousness  Medication Review  atorvastatin, citalopram, hydrochlorothiazide, niacin, pantoprazole, pregabalin, topiramate, and traZODone  History Review  Allergy: Ms. Petron is allergic to penicillins. Drug: Ms. Slagel  reports no history of drug use. Alcohol:  has no history on file for alcohol use. Tobacco:  reports that she has quit smoking. Her smoking use included cigarettes. She has never used smokeless tobacco. Social: Ms. Castilla  reports that she has quit smoking. Her smoking use included cigarettes. She has never used smokeless tobacco. She reports that she does not use drugs. Medical:  has a past medical history of Arthritis, Cataract, GERD (gastroesophageal reflux disease), and Hyperlipidemia. Surgical: Ms. Laymon  has a past  surgical history that includes Knee Arthroplasty and Shoulder arthroscopy with rotator cuff repair and open biceps tenodesis (Bilateral). Family: family history is not on file.  Laboratory Chemistry Profile   Renal Lab Results  Component Value Date   BUN 14 12/22/2019   CREATININE 0.83 12/22/2019   GFRAA >60 12/22/2019   GFRNONAA >60 12/22/2019    Hepatic Lab Results  Component Value Date   AST 15 12/22/2019   ALT 17 12/22/2019   ALBUMIN 4.2 12/22/2019   ALKPHOS 79 12/22/2019    Electrolytes Lab Results  Component Value Date   NA 142 12/22/2019   K 3.8 12/22/2019   CL 105 12/22/2019   CALCIUM 10.5 (H) 12/22/2019    Bone No results found for: VD25OH, VD125OH2TOT, TL5726OM3, TD9741UL8, 25OHVITD1, 25OHVITD2, 25OHVITD3, TESTOFREE, TESTOSTERONE  Inflammation (CRP: Acute Phase) (ESR: Chronic Phase) No results found for: CRP, ESRSEDRATE, LATICACIDVEN       Note: Above Lab results reviewed.   Physical Exam  General appearance: Well nourished, well developed, and well hydrated. In no apparent acute distress Mental status: Alert, oriented x 3 (person, place, & time)       Respiratory: No evidence of acute respiratory distress Eyes: PERLA Vitals: BP (!) 152/72 (BP Location: Right Arm, Patient Position: Sitting, Cuff Size: Normal)   Pulse 75   Temp (!) 96.8 F (36 C) (Temporal)   Resp 16   Ht _0  (1.651 m)   Wt 154 lb (69.9 kg)   SpO2 100%   BMI 25.63 kg/m  BMI: Estimated body mass index is 25.63 kg/m as calculated from the following:   Height as of this encounter: _1  (1.651 m).   Weight as of this encounter: 154 lb (69.9 kg). Ideal: Ideal body weight: 57 kg (125 lb 10.6 oz) Adjusted ideal body weight: 62.1 kg (137 lb)  Improved right shoulder pain and right shoulder range of motion.  Assessment   Status Diagnosis  Improved Controlled Controlled 1. Neuropathic pain of shoulder (Right)   2. Rotator cuff arthropathy of  shoulder (Right)   3. Presence of  artificial shoulder joint (Right)   4. Chronic shoulder pain (Right)       Plan of Care   Ms. Janan Bogie has a current medication list which includes the following long-term medication(s): atorvastatin, citalopram, hydrochlorothiazide, niacin, pantoprazole, pregabalin, topiramate, and trazodone.  Pharmacotherapy (Medications Ordered): No orders of the defined types were placed in this encounter.  Orders:  Orders Placed This Encounter  Procedures   SUPRASCAPULAR NERVE BLOCK    For shoulder pain.    Standing Status:   Standing    Number of Occurrences:   2    Standing Expiration Date:   08/30/2021    Scheduling Instructions:     RIGHT SSNB     Level(s): Suprascapular notch     Sedation: IV versed     Scheduling Timeframe: As permitted by the schedule    Order Specific Question:   Where will this procedure be performed?    Answer:   [facility]   Follow-up plan:   Return if symptoms worsen or fail to improve.        Recent Visits Date Type Provider Dept  02/13/21 Procedure visit Milinda Pointer, MD [facility]  02/05/21 Office Visit Gillis Santa, MD [facility]  Showing recent visits within past 90 days and meeting all other requirements Today's Visits Date Type Provider Dept  03/01/21 Office Visit Gillis Santa, MD [facility]  Showing today's visits and meeting all other requirements Future Appointments No visits were found meeting these conditions. Showing future appointments within next 90 days and meeting all other requirements I discussed the assessment and treatment plan with the patient. The patient was provided an opportunity to ask questions and all were answered. The patient agreed with the plan and demonstrated an understanding of the instructions.  Patient advised to call back or seek an in-person evaluation if the symptoms or condition worsens.  Duration of encounter: 48mnutes.  Note by: BGillis Santa  MD Date: 03/01/2021; Time: 11:49 AM

## 2021-03-01 NOTE — Progress Notes (Signed)
Safety precautions to be maintained throughout the outpatient stay will include: orient to surroundings, keep bed in low position, maintain call bell within reach at all times, provide assistance with transfer out of bed and ambulation.  

## 2021-03-28 ENCOUNTER — Other Ambulatory Visit: Payer: Self-pay

## 2021-03-28 ENCOUNTER — Ambulatory Visit
Admission: RE | Admit: 2021-03-28 | Discharge: 2021-03-28 | Disposition: A | Discharge status: Home / Self Care | Payer: Medicare Other | Source: Ambulatory Visit | Attending: Neurology | Admitting: Neurology

## 2021-03-28 DIAGNOSIS — M545 Low back pain, unspecified: Secondary | ICD-10-CM | POA: Insufficient documentation

## 2021-04-03 ENCOUNTER — Ambulatory Visit
Payer: Medicare Other | Attending: Student in an Organized Health Care Education/Training Program | Admitting: Student in an Organized Health Care Education/Training Program

## 2021-04-03 ENCOUNTER — Other Ambulatory Visit: Payer: Self-pay

## 2021-04-03 ENCOUNTER — Encounter: Payer: Self-pay | Admitting: Student in an Organized Health Care Education/Training Program

## 2021-04-03 VITALS — BP 144/67 | HR 72 | Temp 97.0°F | Ht 65.0 in | Wt 163.6 lb

## 2021-04-03 DIAGNOSIS — M5416 Radiculopathy, lumbar region: Secondary | ICD-10-CM | POA: Insufficient documentation

## 2021-04-03 DIAGNOSIS — G90511 Complex regional pain syndrome I of right upper limb: Secondary | ICD-10-CM | POA: Diagnosis present

## 2021-04-03 DIAGNOSIS — M25511 Pain in right shoulder: Secondary | ICD-10-CM | POA: Diagnosis present

## 2021-04-03 DIAGNOSIS — G8929 Other chronic pain: Secondary | ICD-10-CM | POA: Diagnosis present

## 2021-04-03 DIAGNOSIS — M792 Neuralgia and neuritis, unspecified: Secondary | ICD-10-CM | POA: Insufficient documentation

## 2021-04-03 DIAGNOSIS — M12811 Other specific arthropathies, not elsewhere classified, right shoulder: Secondary | ICD-10-CM | POA: Diagnosis present

## 2021-04-03 DIAGNOSIS — G894 Chronic pain syndrome: Secondary | ICD-10-CM | POA: Diagnosis present

## 2021-04-03 DIAGNOSIS — M48062 Spinal stenosis, lumbar region with neurogenic claudication: Secondary | ICD-10-CM | POA: Diagnosis present

## 2021-04-03 MED ORDER — DICLOFENAC SODIUM 50 MG PO TBEC: 50.0000 mg | DELAYED_RELEASE_TABLET | Freq: Two times a day (BID) | ORAL | 1 refills | Status: DC

## 2021-04-03 NOTE — Patient Instructions (Signed)
Moderate Conscious Sedation, Adult, Care After This sheet gives you information about how to care for yourself after your procedure. Your health care provider may also give you more specific instructions. If you have problems or questions, contact your health care provider. What can I expect after the procedure? After the procedure, it is common to have: Sleepiness for several hours. Impaired judgment for several hours. Difficulty with balance. Vomiting if you eat too soon. Follow these instructions at home: For the time period you were told by your health care provider:   Rest. Do not participate in activities where you could fall or become injured. Do not drive or use machinery. Do not drink alcohol. Do not take sleeping pills or medicines that cause drowsiness. Do not make important decisions or sign legal documents. Do not take care of children on your own. Eating and drinking  Follow the diet recommended by your health care provider. Drink enough fluid to keep your urine pale yellow. If you vomit: Drink water, juice, or soup when you can drink without vomiting. Make sure you have little or no nausea before eating solid foods. General instructions Take over-the-counter and prescription medicines only as told by your health care provider. Have a responsible adult stay with you for the time you are told. It is important to have someone help care for you until you are awake and alert. Do not smoke. Keep all follow-up visits as told by your health care provider. This is important. Contact a health care provider if: You are still sleepy or having trouble with balance after 24 hours. You feel light-headed. You keep feeling nauseous or you keep vomiting. You develop a rash. You have a fever. You have redness or swelling around the IV site. Get help right away if: You have trouble breathing. You have new-onset confusion at home. Summary After the procedure, it is common to feel  sleepy, have impaired judgment, or feel nauseous if you eat too soon. Rest after you get home. Know the things you should not do after the procedure. Follow the diet recommended by your health care provider and drink enough fluid to keep your urine pale yellow. Get help right away if you have trouble breathing or new-onset confusion at home. This information is not intended to replace advice given to you by your health care provider. Make sure you discuss any questions you have with your health care provider. Document Revised: 09/03/2019 Document Reviewed: 04/01/2019 [employer] Patient Education  2022 [employer] GENERAL RISKS AND COMPLICATIONS  What are the risk, side effects and possible complications? Generally speaking, most procedures are safe.  However, with any procedure there are risks, side effects, and the possibility of complications.  The risks and complications are dependent upon the sites that are lesioned, or the type of nerve block to be performed.  The closer the procedure is to the spine, the more serious the risks are.  Great care is taken when placing the radio frequency needles, block needles or lesioning probes, but sometimes complications can occur. Infection: Any time there is an injection through the skin, there is a risk of infection.  This is why sterile conditions are used for these blocks.  There are four possible types of infection. Localized skin infection. Central Nervous System Infection-This can be in the form of Meningitis, which can be deadly. Epidural Infections-This can be in the form of an epidural abscess, which can cause pressure inside of the spine, causing compression of the spinal cord with subsequent  paralysis. This would require an emergency surgery to decompress, and there are no guarantees that the patient would recover from the paralysis. Discitis-This is an infection of the intervertebral discs.  It occurs in about 1% of discography procedures.  It is  difficult to treat and it may lead to surgery.        2. Pain: the needles have to go through skin and soft tissues, will cause soreness.       3. Damage to internal structures:  The nerves to be lesioned may be near blood vessels or    other nerves which can be potentially damaged.       4. Bleeding: Bleeding is more common if the patient is taking blood thinners such as  aspirin, Coumadin, Ticiid, Plavix, etc., or if he/she have some genetic predisposition  such as hemophilia. Bleeding into the spinal canal can cause compression of the spinal  cord with subsequent paralysis.  This would require an emergency surgery to  decompress and there are no guarantees that the patient would recover from the  paralysis.       5. Pneumothorax:  Puncturing of a lung is a possibility, every time a needle is introduced in  the area of the chest or upper back.  Pneumothorax refers to free air around the  collapsed lung(s), inside of the thoracic cavity (chest cavity).  Another two possible  complications related to a similar event would include: Hemothorax and Chylothorax.   These are variations of the Pneumothorax, where instead of air around the collapsed  lung(s), you may have blood or chyle, respectively.       6. Spinal headaches: They may occur with any procedures in the area of the spine.       7. Persistent CSF (Cerebro-Spinal Fluid) leakage: This is a rare problem, but may occur  with prolonged intrathecal or epidural catheters either due to the formation of a fistulous  track or a dural tear.       8. Nerve damage: By working so close to the spinal cord, there is always a possibility of  nerve damage, which could be as serious as a permanent spinal cord injury with  paralysis.       9. Death:  Although rare, severe deadly allergic reactions known as "Anaphylactic  reaction" can occur to any of the medications used.      10. Worsening of the symptoms:  We can always make thing worse.  What are the chances of  something like this happening? Chances of any of this occuring are extremely low.  By statistics, you have more of a chance of getting killed in a motor vehicle accident: while driving to the hospital than any of the above occurring .  Nevertheless, you should be aware that they are possibilities.  In general, it is similar to taking a shower.  Everybody knows that you can slip, hit your head and get killed.  Does that mean that you should not shower again?  Nevertheless always keep in mind that statistics do not mean anything if you happen to be on the wrong side of them.  Even if a procedure has a 1 (one) in a 1,000,000 (million) chance of going wrong, it you happen to be that one..Also, keep in mind that by statistics, you have more of a chance of having something go wrong when taking medications.  Who should not have this procedure? If you are on a blood thinning medication (e.g. Coumadin, Plavix, see  list of "Blood Thinners"), or if you have an active infection going on, you should not have the procedure.  If you are taking any blood thinners, please inform your physician.  How should I prepare for this procedure? Do not eat or drink anything at least six hours prior to the procedure. Bring a driver with you .  It cannot be a taxi. Come accompanied by an adult that can drive you back, and that is strong enough to help you if your legs get weak or numb from the local anesthetic. Take all of your medicines the morning of the procedure with just enough water to swallow them. If you have diabetes, make sure that you are scheduled to have your procedure done first thing in the morning, whenever possible. If you have diabetes, take only half of your insulin dose and notify our nurse that you have done so as soon as you arrive at the clinic. If you are diabetic, but only take blood sugar pills (oral hypoglycemic), then do not take them on the morning of your procedure.  You may take them after you have  had the procedure. Do not take aspirin or any aspirin-containing medications, at least eleven (11) days prior to the procedure.  They may prolong bleeding. Wear loose fitting clothing that may be easy to take off and that you would not mind if it got stained with Betadine or blood. Do not wear any jewelry or perfume Remove any nail coloring.  It will interfere with some of our monitoring equipment.  NOTE: Remember that this is not meant to be interpreted as a complete list of all possible complications.  Unforeseen problems may occur.  BLOOD THINNERS The following drugs contain aspirin or other products, which can cause increased bleeding during surgery and should not be taken for 2 weeks prior to and 1 week after surgery.  If you should need take something for relief of minor pain, you may take acetaminophen which is found in Tylenol,m Datril, Anacin-3 and Panadol. It is not blood thinner. The products listed below are.  Do not take any of the products listed below in addition to any listed on your instruction sheet.  A.P.C or A.P.C with Codeine Codeine Phosphate Capsules #3 Ibuprofen Ridaura  ABC compound Congesprin Imuran rimadil  Advil Cope Indocin Robaxisal  Alka-Seltzer Effervescent Pain Reliever and Antacid Coricidin or Coricidin-D  Indomethacin Rufen  Alka-Seltzer plus Cold Medicine Cosprin Ketoprofen S-A-C Tablets  Anacin Analgesic Tablets or Capsules Coumadin Korlgesic Salflex  Anacin Extra Strength Analgesic tablets or capsules CP-2 Tablets Lanoril Salicylate  Anaprox Cuprimine Capsules Levenox Salocol  Anexsia-D Dalteparin Magan Salsalate  Anodynos Darvon compound Magnesium Salicylate Sine-off  Ansaid Dasin Capsules Magsal Sodium Salicylate  Anturane Depen Capsules Marnal Soma  APF Arthritis pain formula Dewitt's Pills Measurin Stanback  Argesic Dia-Gesic Meclofenamic Sulfinpyrazone  Arthritis Bayer Timed Release Aspirin Diclofenac Meclomen Sulindac  Arthritis pain formula  Anacin Dicumarol Medipren Supac  Analgesic (Safety coated) Arthralgen Diffunasal Mefanamic Suprofen  Arthritis Strength Bufferin Dihydrocodeine Mepro Compound Suprol  Arthropan liquid Dopirydamole Methcarbomol with Aspirin Synalgos  ASA tablets/Enseals Disalcid Micrainin Tagament  Ascriptin Doan's Midol Talwin  Ascriptin A/D Dolene Mobidin Tanderil  Ascriptin Extra Strength Dolobid Moblgesic Ticlid  Ascriptin with Codeine Doloprin or Doloprin with Codeine Momentum Tolectin  Asperbuf Duoprin Mono-gesic Trendar  Aspergum Duradyne Motrin or Motrin IB Triminicin  Aspirin plain, buffered or enteric coated Durasal Myochrisine Trigesic  Aspirin Suppositories Easprin Nalfon Trillsate  Aspirin with Codeine Ecotrin Regular or Extra  Strength Naprosyn Uracel  Atromid-S Efficin Naproxen Ursinus  Auranofin Capsules Elmiron Neocylate Vanquish  Axotal Emagrin Norgesic Verin  Azathioprine Empirin or Empirin with Codeine Normiflo Vitamin E  Azolid Emprazil Nuprin Voltaren  Bayer Aspirin plain, buffered or children's or timed BC Tablets or powders Encaprin Orgaran Warfarin Sodium  Buff-a-Comp Enoxaparin Orudis Zorpin  Buff-a-Comp with Codeine Equegesic Os-Cal-Gesic   Buffaprin Excedrin plain, buffered or Extra Strength Oxalid   Bufferin Arthritis Strength Feldene Oxphenbutazone   Bufferin plain or Extra Strength Feldene Capsules Oxycodone with Aspirin   Bufferin with Codeine Fenoprofen Fenoprofen Pabalate or Pabalate-SF   Buffets II Flogesic Panagesic   Buffinol plain or Extra Strength Florinal or Florinal with Codeine Panwarfarin   Buf-Tabs Flurbiprofen Penicillamine   Butalbital Compound Four-way cold tablets Penicillin   Butazolidin Fragmin Pepto-Bismol   Carbenicillin Geminisyn Percodan   Carna Arthritis Reliever Geopen Persantine   Carprofen Gold's salt Persistin   Chloramphenicol Goody's Phenylbutazone   Chloromycetin Haltrain Piroxlcam   Clmetidine heparin Plaquenil   Cllnoril Hyco-pap  Ponstel   Clofibrate Hydroxy chloroquine Propoxyphen         Before stopping any of these medications, be sure to consult the physician who ordered them.  Some, such as Coumadin (Warfarin) are ordered to prevent or treat serious conditions such as "deep thrombosis", "pumonary embolisms", and other heart problems.  The amount of time that you may need off of the medication may also vary with the medication and the reason for which you were taking it.  If you are taking any of these medications, please make sure you notify your pain physician before you undergo any procedures.         Epidural Steroid Injection Patient Information  Description: The epidural space surrounds the nerves as they exit the spinal cord.  In some patients, the nerves can be compressed and inflamed by a bulging disc or a tight spinal canal (spinal stenosis).  By injecting steroids into the epidural space, we can bring irritated nerves into direct contact with a potentially helpful medication.  These steroids act directly on the irritated nerves and can reduce swelling and inflammation which often leads to decreased pain.  Epidural steroids may be injected anywhere along the spine and from the neck to the low back depending upon the location of your pain.   After numbing the skin with local anesthetic (like Novocaine), a small needle is passed into the epidural space slowly.  You may experience a sensation of pressure while this is being done.  The entire block usually last less than 10 minutes.  Conditions which may be treated by epidural steroids:  Low back and leg pain Neck and arm pain Spinal stenosis Post-laminectomy syndrome Herpes zoster (shingles) pain Pain from compression fractures  Preparation for the injection:  Do not eat any solid food or dairy products within 8 hours of your appointment.  You may drink clear liquids up to 3 hours before appointment.  Clear liquids include water, black coffee, juice  or soda.  No milk or cream please. You may take your regular medication, including pain medications, with a sip of water before your appointment  Diabetics should hold regular insulin (if taken separately) and take 1/2 normal NPH dos the morning of the procedure.  Carry some sugar containing items with you to your appointment. A driver must accompany you and be prepared to drive you home after your procedure.  Bring all your current medications with your. An IV may be inserted and sedation may  be given at the discretion of the physician.   A blood pressure cuff, EKG and other monitors will often be applied during the procedure.  Some patients may need to have extra oxygen administered for a short period. You will be asked to provide medical information, including your allergies, prior to the procedure.  We must know immediately if you are taking blood thinners (like Coumadin/Warfarin)  Or if you are allergic to IV iodine contrast (dye). We must know if you could possible be pregnant.  Possible side-effects: Bleeding from needle site Infection (rare, may require surgery) Nerve injury (rare) Numbness & tingling (temporary) Difficulty urinating (rare, temporary) Spinal headache ( a headache worse with upright posture) Light -headedness (temporary) Pain at injection site (several days) Decreased blood pressure (temporary) Weakness in arm/leg (temporary) Pressure sensation in back/neck (temporary)  Call if you experience: Fever/chills associated with headache or increased back/neck pain. Headache worsened by an upright position. New onset weakness or numbness of an extremity below the injection site Hives or difficulty breathing (go to the emergency room) Inflammation or drainage at the infection site Severe back/neck pain Any new symptoms which are concerning to you  Please note:  Although the local anesthetic injected can often make your back or neck feel good for several hours after the  injection, the pain will likely return.  It takes 3-7 days for steroids to work in the epidural space.  You may not notice any pain relief for at least that one week.  If effective, we will often do a series of three injections spaced 3-6 weeks apart to maximally decrease your pain.  After the initial series, we generally will wait several months before considering a repeat injection of the same type.  If you have any questions, please call [phone] [facility]

## 2021-04-03 NOTE — Progress Notes (Signed)
Safety precautions to be maintained throughout the outpatient stay will include: orient to surroundings, keep bed in low position, maintain call bell within reach at all times, provide assistance with transfer out of bed and ambulation.  

## 2021-04-03 NOTE — Progress Notes (Signed)
PROVIDER NOTE: Information contained herein reflects review and annotations entered in association with encounter. Interpretation of such information and data should be left to medically-trained personnel. Information provided to patient can be located elsewhere in the medical record under "Patient Instructions". Document created using STT-dictation technology, any transcriptional errors that may result from process are unintentional.    Patient: Claudia Brennan  Service Category: E/M  Provider: Gillis Santa, MD  DOB: December 24, 1950  DOS: 04/03/2021  Specialty: Interventional Pain Management  MRN: [id number]  Setting: Ambulatory outpatient  PCP: Irish Elders, MD  Type: Established Patient    Referring Provider: Irish Elders, MD  Location: Office  Delivery: Face-to-face     HPI  Ms. Minnetta Sandora, a 70 y.o. year old female has Complex regional pain syndrome type 2 of right upper extremity; Neuropathy, axillary nerve; Neuropathic pain of shoulder (Right); S/P reverse arthroplasty of shoulder (Right); Chronic pain syndrome; Aftercare following joint replacement surgery; Anemia; Bilateral chronic knee pain; Closed fracture of proximal phalanx of great toe; DDD (degenerative disc disease), cervical; Disorder of bursae of shoulder region; HTN (hypertension); Gastroesophageal reflux disease; Hyperlipidemia; Insomnia; Major depressive disorder, single episode, unspecified; Osteoarthritis of right shoulder; Other cervical disc degeneration, unspecified cervical region; Personal history of nicotine dependence; Presence of artificial shoulder joint (Right); Primary generalized (osteo)arthritis; Stiffness of shoulder joint; Acquired deformity of limb; Alcohol intake above recommended sensible limits; Anxiety disorder; Chronic low back pain; Closed fracture of phalanx of foot; Contusion of right foot; Depressive disorder; Diverticulosis; Full thickness rotator cuff tear; Genital herpes; H/O adenomatous polyp of  colon; Pain in shoulder region after shoulder replacement (Right); Headache, unspecified headache type; Neck pain; Hx of smoking; Kidney stone; Localized, primary osteoarthritis of hand; Metatarsalgia; Muscle pain; Muscle weakness; Open fracture of distal phalanx of great toe; Osteoarthritis of knee; Pain in thoracic spine; Calcific tendinitis of shoulder; Plantar fasciitis of right foot; Prediabetes; Rotator cuff arthropathy of shoulder (Right); Sciatica; Smoker; Spinal stenosis, lumbar region, with neurogenic claudication; Thoracic back sprain; Chronic shoulder pain (Right); Complex regional pain syndrome type 1 affecting shoulder (Right); Abnormal CT scan, cervical spine ([facility]) (11/26/2019); Grade 1 Anterolisthesis of cervical spine C3/C4 and C7/T1; Grade 1 Anterolisthesis of thoracic spine of T2/T3; Cervical facet arthropathy (Multilevel) (Bilateral); Radicular pain of shoulder (Right); 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) (Left: C3-4); and Chronic radicular lumbar pain on their problem list. , is here today because of her Spinal stenosis, lumbar region, with neurogenic claudication [M48.062]. Ms. Hasten primary complain today is Back Pain (lower) and Neck Pain Last encounter: My last encounter with her was on 03/01/21 Pain Assessment: Severity of Chronic pain is reported as a 7 /10. Location: Neck  /to middle of upper back. Onset: More than a month ago. Quality: Aching, Sharp. Timing: Constant. Modifying factor(s): PT exercises, rest. Vitals:  height is _0  (1.651 m) and weight is 163 lb 9.6 oz (74.2 kg). Her temporal temperature is 97 F (36.1 C) (abnormal). Her blood pressure is 144/67 (abnormal) and her pulse is 72. Her oxygen saturation is 100%.   Reason for encounter: worsening of previously known (established) problem   Patient presents for increased low back pain with radiation into her bilateral legs, right greater than left.  She completed her lumbar MRI, results are  below. She continues on Lyrica 75 mg twice daily prescribed by Dr. Melrose Nakayama with neurology.  Attempts to increase to 3 times daily resulted in side effects of sedation. She is requesting something for her low back pain.  ROS  Constitutional: Denies any fever or chills Gastrointestinal: No reported hemesis, hematochezia, vomiting, or acute GI distress Musculoskeletal:  Low back and bilateral leg pain Neurological: No reported episodes of acute onset apraxia, aphasia, dysarthria, agnosia, amnesia, paralysis, loss of coordination, or loss of consciousness  Medication Review  atorvastatin, citalopram, diclofenac, hydrochlorothiazide, niacin, pantoprazole, pregabalin, topiramate, and traZODone  History Review  Allergy: Ms. Cobbins is allergic to penicillins. Drug: Ms. Skilton  reports no history of drug use. Alcohol:  has no history on file for alcohol use. Tobacco:  reports that she has quit smoking. Her smoking use included cigarettes. She has never used smokeless tobacco. Social: Ms. Lafortune  reports that she has quit smoking. Her smoking use included cigarettes. She has never used smokeless tobacco. She reports that she does not use drugs. Medical:  has a past medical history of Arthritis, Cataract, GERD (gastroesophageal reflux disease), and Hyperlipidemia. Surgical: Ms. Guizar  has a past surgical history that includes Knee Arthroplasty and Shoulder arthroscopy with rotator cuff repair and open biceps tenodesis (Bilateral). Family: family history is not on file.  Laboratory Chemistry Profile   Renal Lab Results  Component Value Date   BUN 14 12/22/2019   CREATININE 0.83 12/22/2019   GFRAA >60 12/22/2019   GFRNONAA >60 12/22/2019    Hepatic Lab Results  Component Value Date   AST 15 12/22/2019   ALT 17 12/22/2019   ALBUMIN 4.2 12/22/2019   ALKPHOS 79 12/22/2019    Electrolytes Lab Results  Component Value Date   NA 142 12/22/2019   K 3.8 12/22/2019   CL 105 12/22/2019    CALCIUM 10.5 (H) 12/22/2019    Bone No results found for: VD25OH, VD125OH2TOT, MV7846NG2, XB2841LK4, 25OHVITD1, 25OHVITD2, 25OHVITD3, TESTOFREE, TESTOSTERONE  Inflammation (CRP: Acute Phase) (ESR: Chronic Phase) No results found for: CRP, ESRSEDRATE, LATICACIDVEN       Note: Above Lab results reviewed.  CLINICAL DATA:  70 year old female with persistent low back pain. Occasional right leg pain.   EXAM: MRI LUMBAR SPINE WITHOUT CONTRAST   TECHNIQUE: Multiplanar, multisequence MR imaging of the lumbar spine was performed. No intravenous contrast was administered.   COMPARISON:  None.   FINDINGS: Segmentation: Lumbar segmentation appears to be normal and will be designated as such for this report.   Alignment: Grade 1 anterolisthesis of L5 on S1 measures 3-4 mm. Mild straightening of lumbar lordosis otherwise.   Vertebrae: Patchy degenerative endplate marrow edema in the lower thoracic spine at T10-T11, eccentric to the left. Similar mild edema associated with a small developing left S1 superior endplate Schmorl's node on series 4, image 11. chronic L2 superior endplate Schmorl's node. Small chronic inferior endplate Schmorl's nodes at T12. Normal background bone marrow signal. Intact visible sacrum and SI joints.   Conus medullaris and cauda equina: Conus extends to the T12-L1 level. No lower spinal cord or conus signal abnormality.   Paraspinal and other soft tissues: Negative.   Disc levels:   T10-T11: Severe disc space loss. Probable vacuum disc. Circumferential disc osteophyte complex eccentric to the left. Mild to moderate facet hypertrophy. Borderline to mild spinal stenosis. Mild to moderate left T10 foraminal stenosis.   T11-T12: Mild disc bulge and facet hypertrophy. Mild left T11 foraminal stenosis.   T12-L1: Mild disc bulging and facet hypertrophy. Borderline to mild bilateral T12 foraminal stenosis.   L1-L2: Mild circumferential disc bulge and posterior  element hypertrophy. No significant stenosis.   L2-L3: Severe disc space loss. Evidence of vacuum disc. Circumferential disc osteophyte complex.  Mild posterior element hypertrophy. Borderline to mild spinal stenosis. Mild right greater than left L2 foraminal stenosis.   L3-L4: Disc desiccation and circumferential disc bulge eccentric to the left. Broad-based posterior component. Moderate facet and moderate to severe ligament flavum hypertrophy. Trace degenerative facet joint fluid and small subligamentous synovial cysts on the right (series 5, image 30). Mild to moderate spinal stenosis. Moderate left L3 foraminal stenosis.   L4-L5: Disc desiccation with severe disc space loss. Circumferential although mostly far lateral disc osteophyte complex. Moderate facet and ligament flavum hypertrophy. Borderline to mild spinal stenosis. Moderate bilateral L4 foraminal stenosis greater on the left.   L5-S1: Grade 1 anterolisthesis. Mild circumferential disc/pseudo disc. Moderate bilateral facet hypertrophy. Trace degenerative facet joint fluid. But no significant stenosis at this level.   IMPRESSION: 1. Advanced lower thoracic disc degeneration at T10-T11 with associated patchy degenerative endplate marrow edema, eccentric to the left. Similar edema at a small developing left S1 superior endplate Schmorl's node.   2. Widespread chronic lumbar disc and endplate degeneration. Moderate posterior element degeneration, maximal at L3-L4.   3. Multifactorial mild to moderate spinal stenosis at L3-L4. And up to mild spinal stenosis at L2-L3 and L4-L5.   4. Moderate neural foraminal stenosis at the left L3 and bilateral L4 nerve levels.     Electronically Signed   By: Genevie Ann M.D.   On: 03/29/2021 08:03  Physical Exam  General appearance: Well nourished, well developed, and well hydrated. In no apparent acute distress Mental status: Alert, oriented x 3 (person, place, & time)        Respiratory: No evidence of acute respiratory distress Eyes: PERLA Vitals: BP (!) 144/67   Pulse 72   Temp (!) 97 F (36.1 C) (Temporal)   Ht _0  (1.651 m)   Wt 163 lb 9.6 oz (74.2 kg)   SpO2 100%   BMI 27.22 kg/m  BMI: Estimated body mass index is 27.22 kg/m as calculated from the following:   Height as of this encounter: _1  (1.651 m).   Weight as of this encounter: 163 lb 9.6 oz (74.2 kg). Ideal: Ideal body weight: 57 kg (125 lb 10.6 oz) Adjusted ideal body weight: 63.9 kg (140 lb 13.4 oz)  Lumbar Spine Area Exam  Skin & Axial Inspection: No masses, redness, or swelling Alignment: Symmetrical Functional ROM: Pain restricted ROM       Stability: No instability detected Muscle Tone/Strength: Functionally intact. No obvious neuro-muscular anomalies detected. Sensory (Neurological): Dermatomal pain pattern  Gait & Posture Assessment  Ambulation: Unassisted Gait: Relatively normal for age and body habitus Posture: Difficulty standing up straight, due to pain  Lower Extremity Exam    Side: Right lower extremity  Side: Left lower extremity  Stability: No instability observed          Stability: No instability observed          Skin & Extremity Inspection: Skin color, temperature, and hair growth are WNL. No peripheral edema or cyanosis. No masses, redness, swelling, asymmetry, or associated skin lesions. No contractures.  Skin & Extremity Inspection: Skin color, temperature, and hair growth are WNL. No peripheral edema or cyanosis. No masses, redness, swelling, asymmetry, or associated skin lesions. No contractures.  Functional ROM: Pain restricted ROM for hip and knee joints          Functional ROM: Unrestricted ROM                  Muscle Tone/Strength: Functionally intact. No obvious  neuro-muscular anomalies detected.  Muscle Tone/Strength: Functionally intact. No obvious neuro-muscular anomalies detected.  Sensory (Neurological): Dermatomal pain pattern        Sensory  (Neurological): Dermatomal pain pattern         Assessment   Status Diagnosis  Persistent Having a Flare-up Controlled 1. Spinal stenosis, lumbar region, with neurogenic claudication   2. Chronic radicular lumbar pain   3. Neuropathic pain of shoulder (Right)   4. Rotator cuff arthropathy of shoulder (Right)   5. Chronic shoulder pain (Right)   6. Complex regional pain syndrome type 1 affecting shoulder (Right)   7. Chronic pain syndrome       Plan of Care   Ms. Ryleah Miramontes has a current medication list which includes the following long-term medication(s): atorvastatin, citalopram, hydrochlorothiazide, niacin, pantoprazole, pregabalin, topiramate, and trazodone.  Pharmacotherapy (Medications Ordered): Meds ordered this encounter  Medications   diclofenac (VOLTAREN) 50 MG EC tablet    Sig: Take 1 tablet (50 mg total) by mouth 2 (two) times daily.    Dispense:  60 tablet    Refill:  1    Orders:  Orders Placed This Encounter  Procedures   Lumbar Epidural Injection    Standing Status:   Future    Standing Expiration Date:   05/03/2021    Scheduling Instructions:     Procedure: Interlaminar Lumbar Epidural Steroid injection (LESI)            Laterality: Midline     Sedation: with IV versed     Timeframe: ASAA    Order Specific Question:   Where will this procedure be performed?    Answer:   [facility]   Repeat right suprascapular nerve block for right shoulder pain as needed Continue Lyrica as prescribed by Dr. Melrose Nakayama. Follow-up for lumbar epidural steroid injection with IV Versed in early December.  Follow-up plan:   Return in about 20 days (around 04/23/2021) for L-ESI (IV Versed, in clinic).        Recent Visits Date Type Provider Dept  03/01/21 Office Visit Gillis Santa, MD [facility]  02/13/21 Procedure visit Milinda Pointer, MD [facility]  02/05/21 Office Visit Gillis Santa, MD [facility]  Showing recent  visits within past 90 days and meeting all other requirements Today's Visits Date Type Provider Dept  04/03/21 Office Visit Gillis Santa, MD [facility]  Showing today's visits and meeting all other requirements Future Appointments No visits were found meeting these conditions. Showing future appointments within next 90 days and meeting all other requirements I discussed the assessment and treatment plan with the patient. The patient was provided an opportunity to ask questions and all were answered. The patient agreed with the plan and demonstrated an understanding of the instructions.  Patient advised to call back or seek an in-person evaluation if the symptoms or condition worsens.  Duration of encounter: 55mnutes.  Note by: BGillis Santa MD Date: 04/03/2021; Time: 11:42 AM

## 2021-04-23 ENCOUNTER — Ambulatory Visit
Admission: RE | Admit: 2021-04-23 | Discharge: 2021-04-23 | Disposition: A | Discharge status: Home / Self Care | Payer: Medicare Other | Source: Ambulatory Visit | Attending: Student in an Organized Health Care Education/Training Program | Admitting: Student in an Organized Health Care Education/Training Program

## 2021-04-23 ENCOUNTER — Other Ambulatory Visit: Payer: Self-pay

## 2021-04-23 ENCOUNTER — Encounter: Payer: Self-pay | Admitting: Student in an Organized Health Care Education/Training Program

## 2021-04-23 ENCOUNTER — Ambulatory Visit (HOSPITAL_BASED_OUTPATIENT_CLINIC_OR_DEPARTMENT_OTHER): Payer: Medicare Other | Admitting: Student in an Organized Health Care Education/Training Program

## 2021-04-23 VITALS — BP 125/91 | HR 66 | Temp 97.1°F | Resp 16 | Ht 65.0 in | Wt 164.0 lb

## 2021-04-23 DIAGNOSIS — G8929 Other chronic pain: Secondary | ICD-10-CM

## 2021-04-23 DIAGNOSIS — M5416 Radiculopathy, lumbar region: Secondary | ICD-10-CM

## 2021-04-23 DIAGNOSIS — M48062 Spinal stenosis, lumbar region with neurogenic claudication: Secondary | ICD-10-CM

## 2021-04-23 DIAGNOSIS — G894 Chronic pain syndrome: Secondary | ICD-10-CM | POA: Insufficient documentation

## 2021-04-23 MED ORDER — ROPIVACAINE HCL 2 MG/ML IJ SOLN
2.0000 mL | Freq: Once | INTRAMUSCULAR | Status: AC
  Administered 2021-04-23: 20 mL via EPIDURAL

## 2021-04-23 MED ORDER — SODIUM CHLORIDE 0.9% FLUSH
2.0000 mL | Freq: Once | INTRAVENOUS | Status: AC
  Administered 2021-04-23: 10 mL

## 2021-04-23 MED ORDER — MIDAZOLAM HCL 5 MG/5ML IJ SOLN
0.5000 mg | Freq: Once | INTRAMUSCULAR | Status: AC
  Administered 2021-04-23: 2 mg via INTRAVENOUS
  Filled 2021-04-23: qty 5

## 2021-04-23 MED ORDER — IOHEXOL 180 MG/ML  SOLN
10.0000 mL | Freq: Once | INTRAMUSCULAR | Status: AC
  Administered 2021-04-23: 10 mL via EPIDURAL
  Filled 2021-04-23: qty 20

## 2021-04-23 MED ORDER — LIDOCAINE HCL 2 % IJ SOLN
20.0000 mL | Freq: Once | INTRAMUSCULAR | Status: AC
  Administered 2021-04-23: 400 mg
  Filled 2021-04-23: qty 20

## 2021-04-23 MED ORDER — DEXAMETHASONE SODIUM PHOSPHATE 10 MG/ML IJ SOLN
10.0000 mg | Freq: Once | INTRAMUSCULAR | Status: AC
  Administered 2021-04-23: 10 mg
  Filled 2021-04-23: qty 1

## 2021-04-23 NOTE — Progress Notes (Signed)
PROVIDER NOTE: Information contained herein reflects review and annotations entered in association with encounter. Interpretation of such information and data should be left to medically-trained personnel. Information provided to patient can be located elsewhere in the medical record under "Patient Instructions". Document created using STT-dictation technology, any transcriptional errors that may result from process are unintentional.    Patient: Claudia Brennan  Service Category: Procedure Provider: Edward Jolly, MD DOB: 1950-06-20 DOS: 04/23/2021 Location: [facility] MRN: [id number] Setting: Ambulatory - outpatient Referring Provider: Eldridge Abrahams, MD Type: Established Patient Specialty: Interventional Pain Management PCP: Eldridge Abrahams, MD  Primary Reason for Visit: Interventional Pain Management Treatment. CC: Back Pain (From neck to lower back)   Procedure:          Anesthesia, Analgesia, Anxiolysis:  Type: Diagnostic Inter-Laminar Epidural Steroid Injection  #1  Region: Lumbar Level: L4-5 Level. Laterality: Left-Sided         Anesthesia: Local (1-2% Lidocaine)  Anxiolysis: IV  Sedation: Minimal  Guidance: Fluoroscopy           Position: Prone with head of the table was raised to facilitate breathing.   Indications: 1. Spinal stenosis, lumbar region, with neurogenic claudication   2. Chronic radicular lumbar pain   3. Chronic pain syndrome    Pain Score: Pre-procedure: 5/10 Post-procedure: 0-No pain/10    Pre-op H&P Assessment:  Ms. Docter is a 70 y.o. (year old), female patient, seen today for interventional treatment. She  has a past surgical history that includes Knee Arthroplasty and Shoulder arthroscopy with rotator cuff repair and open biceps tenodesis (Bilateral). Ms. Hurd has a current medication list which includes the following prescription(s): atorvastatin, citalopram, diclofenac, hydrochlorothiazide, niacin, pantoprazole, pregabalin,  topiramate, and trazodone. Her primarily concern today is the Back Pain (From neck to lower back)  Initial Vital Signs:  Pulse/HCG Rate: 66ECG Heart Rate: 66 Temp: (!) 97.1 F (36.2 C) Resp: 16 BP: 116/80 SpO2: 100 %  BMI: Estimated body mass index is 27.29 kg/m as calculated from the following:   Height as of this encounter:  (1.651 m).   Weight as of this encounter: 164 lb (74.4 kg).  Risk Assessment: Allergies: Reviewed. She is allergic to penicillins.  Allergy Precautions: None required Coagulopathies: Reviewed. None identified.  Blood-thinner therapy: None at this time Active Infection(s): Reviewed. None identified. Ms. Dragovich is afebrile  Site Confirmation: Ms. Stegenga was asked to confirm the procedure and laterality before marking the site Procedure checklist: Completed Consent: Before the procedure and under the influence of no sedative(s), amnesic(s), or anxiolytics, the patient was informed of the treatment options, risks and possible complications. To fulfill our ethical and legal obligations, as recommended by the American Medical Association's Code of Ethics, I have informed the patient of my clinical impression; the nature and purpose of the treatment or procedure; the risks, benefits, and possible complications of the intervention; the alternatives, including doing nothing; the risk(s) and benefit(s) of the alternative treatment(s) or procedure(s); and the risk(s) and benefit(s) of doing nothing. The patient was provided information about the general risks and possible complications associated with the procedure. These may include, but are not limited to: failure to achieve desired goals, infection, bleeding, organ or nerve damage, allergic reactions, paralysis, and death. In addition, the patient was informed of those risks and complications associated to Spine-related procedures, such as failure to decrease pain; infection (i.e.: Meningitis, epidural or intraspinal  abscess); bleeding (i.e.: epidural hematoma, subarachnoid hemorrhage, or any other type of intraspinal or  peri-dural bleeding); organ or nerve damage (i.e.: Any type of peripheral nerve, nerve root, or spinal cord injury) with subsequent damage to sensory, motor, and/or autonomic systems, resulting in permanent pain, numbness, and/or weakness of one or several areas of the body; allergic reactions; (i.e.: anaphylactic reaction); and/or death. Furthermore, the patient was informed of those risks and complications associated with the medications. These include, but are not limited to: allergic reactions (i.e.: anaphylactic or anaphylactoid reaction(s)); adrenal axis suppression; blood sugar elevation that in diabetics may result in ketoacidosis or comma; water retention that in patients with history of congestive heart failure may result in shortness of breath, pulmonary edema, and decompensation with resultant heart failure; weight gain; swelling or edema; medication-induced neural toxicity; particulate matter embolism and blood vessel occlusion with resultant organ, and/or nervous system infarction; and/or aseptic necrosis of one or more joints. Finally, the patient was informed that Medicine is not an exact science; therefore, there is also the possibility of unforeseen or unpredictable risks and/or possible complications that may result in a catastrophic outcome. The patient indicated having understood very clearly. We have given the patient no guarantees and we have made no promises. Enough time was given to the patient to ask questions, all of which were answered to the patient's satisfaction. Ms. Matt has indicated that she wanted to continue with the procedure. Attestation: I, the ordering provider, attest that I have discussed with the patient the benefits, risks, side-effects, alternatives, likelihood of achieving goals, and potential problems during recovery for the procedure that I have provided  informed consent. Date  Time: 04/23/2021  8:05 AM  Pre-Procedure Preparation:  Monitoring: As per clinic protocol. Respiration, ETCO2, SpO2, BP, heart rate and rhythm monitor placed and checked for adequate function Safety Precautions: Patient was assessed for positional comfort and pressure points before starting the procedure. Time-out: I initiated and conducted the "Time-out" before starting the procedure, as per protocol. The patient was asked to participate by confirming the accuracy of the "Time Out" information. Verification of the correct person, site, and procedure were performed and confirmed by me, the nursing staff, and the patient. "Time-out" conducted as per Joint Commission's Universal Protocol (UP.01.01.01). Time: 0908  Description of Procedure:          Target Area: The interlaminar space, initially targeting the lower laminar border of the superior vertebral body. Approach: Paramedial approach. Area Prepped: Entire Posterior Lumbar Region DuraPrep (Iodine Povacrylex [0.7% available iodine] and Isopropyl Alcohol, 74% w/w) Safety Precautions: Aspiration looking for blood return was conducted prior to all injections. At no point did we inject any substances, as a needle was being advanced. No attempts were made at seeking any paresthesias. Safe injection practices and needle disposal techniques used. Medications properly checked for expiration dates. SDV (single dose vial) medications used. Description of the Procedure: Protocol guidelines were followed. The procedure needle was introduced through the skin, ipsilateral to the reported pain, and advanced to the target area. Bone was contacted and the needle walked caudad, until the lamina was cleared. The epidural space was identified using "loss-of-resistance technique" with 2-3 ml of PF-NaCl (0.9% NSS), in a 5cc LOR glass syringe.  Vitals:   04/23/21 0910 04/23/21 0915 04/23/21 0920 04/23/21 0929  BP: 128/80 121/89 (!) 142/80 (!)  125/91  Pulse:      Resp: 14 17 17 16   Temp:      TempSrc:      SpO2: 100% 95% 98% 98%  Weight:      Height:  Start Time: 0908 hrs. End Time: 0921 hrs.  Materials:  Needle(s) Type: Epidural needle Gauge: 22G Length: 3.5-in Medication(s): Please see orders for medications and dosing details. 6 cc solution made of 3 cc of preservative-free saline, 2 cc of 0.2% ropivacaine, 1 cc of Decadron 10 mg/cc.  Imaging Guidance (Spinal):          Type of Imaging Technique: Fluoroscopy Guidance (Spinal) Indication(s): Assistance in needle guidance and placement for procedures requiring needle placement in or near specific anatomical locations not easily accessible without such assistance. Exposure Time: Please see nurses notes. Contrast: Before injecting any contrast, we confirmed that the patient did not have an allergy to iodine, shellfish, or radiological contrast. Once satisfactory needle placement was completed at the desired level, radiological contrast was injected. Contrast injected under live fluoroscopy. No contrast complications. See chart for type and volume of contrast used. Fluoroscopic Guidance: I was personally present during the use of fluoroscopy. "Tunnel Vision Technique" used to obtain the best possible view of the target area. Parallax error corrected before commencing the procedure. "Direction-depth-direction" technique used to introduce the needle under continuous pulsed fluoroscopy. Once target was reached, antero-posterior, oblique, and lateral fluoroscopic projection used confirm needle placement in all planes. Images permanently stored in EMR. Interpretation: I personally interpreted the imaging intraoperatively. Adequate needle placement confirmed in multiple planes. Appropriate spread of contrast into desired area was observed. No evidence of afferent or efferent intravascular uptake. No intrathecal or subarachnoid spread observed. Permanent images saved into the  patient's record.  Post-operative Assessment:  Post-procedure Vital Signs:  Pulse/HCG Rate: 6668 Temp: (!) 97.1 F (36.2 C) Resp: 16 BP: (!) 125/91 SpO2: 98 %  EBL: None  Complications: No immediate post-treatment complications observed by team, or reported by patient.  Note: The patient tolerated the entire procedure well. A repeat set of vitals were taken after the procedure and the patient was kept under observation following institutional policy, for this type of procedure. Post-procedural neurological assessment was performed, showing return to baseline, prior to discharge. The patient was provided with post-procedure discharge instructions, including a section on how to identify potential problems. Should any problems arise concerning this procedure, the patient was given instructions to immediately contact us, at any time, without hesitation. In any case, we plan to contact the patient by telephone for a follow-up status report regarding this interventional procedure.  Comments:  No additional relevant information.  Plan of Care  Orders:  Orders Placed This Encounter  Procedures   DG PAIN CLINIC C-ARM 1-60 MIN NO REPORT    Intraoperative interpretation by procedural physician at [facility].    Standing Status:   Standing    Number of Occurrences:   1    Order Specific Question:   Reason for exam:    Answer:   Assistance in needle guidance and placement for procedures requiring needle placement in or near specific anatomical locations not easily accessible without such assistance.    Medications ordered for procedure: Meds ordered this encounter  Medications   iohexol (OMNIPAQUE) 180 MG/ML injection 10 mL    Must be Myelogram-compatible. If not available, you may substitute with a water-soluble, non-ionic, hypoallergenic, myelogram-compatible radiological contrast medium.   lidocaine (XYLOCAINE) 2 % (with pres) injection 400 mg   midazolam (VERSED) 5 MG/5ML  injection 0.5-2 mg    Make sure Flumazenil is available in the pyxis when using this medication. If oversedation occurs, administer 0.2 mg IV over 15 sec. If after 45 sec no response,  administer 0.2 mg again over 1 min; may repeat at 1 min intervals; not to exceed 4 doses (1 mg)   ropivacaine (PF) 2 mg/mL (0.2%) (NAROPIN) injection 2 mL   sodium chloride flush (NS) 0.9 % injection 2 mL   dexamethasone (DECADRON) injection 10 mg   Medications administered: We administered iohexol, lidocaine, midazolam, ropivacaine (PF) 2 mg/mL (0.2%), sodium chloride flush, and dexamethasone.  See the medical record for exact dosing, route, and time of administration.  Follow-up plan:   Return in about 6 weeks (around 06/04/2021) for Post Procedure Evaluation, virtual.     Recent Visits Date Type Provider Dept  04/03/21 Office Visit Edward Jolly, MD [facility]  03/01/21 Office Visit Edward Jolly, MD [facility]  02/13/21 Procedure visit Delano Metz, MD [facility]  02/05/21 Office Visit Edward Jolly, MD [facility]  Showing recent visits within past 90 days and meeting all other requirements Today's Visits Date Type Provider Dept  04/23/21 Procedure visit Edward Jolly, MD [facility]  Showing today's visits and meeting all other requirements Future Appointments Date Type Provider Dept  06/04/21 Appointment Edward Jolly, MD [facility]  Showing future appointments within next 90 days and meeting all other requirements Disposition: Discharge home  Discharge (Date  Time): 04/23/2021; 0935 hrs.   Primary Care Physician: Annia Belt Dayton Bailiff, MD Location: [facility] Note by: Edward Jolly, MD Date: 04/23/2021; Time: 10:24 AM  Disclaimer:  Medicine is not an exact science. The only guarantee in medicine is that nothing is guaranteed. It is important to note that the decision to proceed with this  intervention was based on the information collected from the patient. The Data and conclusions were drawn from the patient's questionnaire, the interview, and the physical examination. Because the information was provided in large part by the patient, it cannot be guaranteed that it has not been purposely or unconsciously manipulated. Every effort has been made to obtain as much relevant data as possible for this evaluation. It is important to note that the conclusions that lead to this procedure are derived in large part from the available data. Always take into account that the treatment will also be dependent on availability of resources and existing treatment guidelines, considered by other Pain Management Practitioners as being common knowledge and practice, at the time of the intervention. For Medico-Legal purposes, it is also important to point out that variation in procedural techniques and pharmacological choices are the acceptable norm. The indications, contraindications, technique, and results of the above procedure should only be interpreted and judged by a Board-Certified Interventional Pain Specialist with extensive familiarity and expertise in the same exact procedure and technique.

## 2021-04-23 NOTE — Patient Instructions (Signed)

## 2021-04-23 NOTE — Progress Notes (Signed)
Safety precautions to be maintained throughout the outpatient stay will include: orient to surroundings, keep bed in low position, maintain call bell within reach at all times, provide assistance with transfer out of bed and ambulation.  

## 2021-04-24 ENCOUNTER — Telehealth: Payer: Self-pay

## 2021-04-24 NOTE — Telephone Encounter (Signed)
Called PP. No answer, LM to call if needed.

## 2021-05-22 IMAGING — NM NM BONE 3 PHASE
1 series · 6 of 6 positions shown · non-contrast
Comparison: None

CLINICAL DATA: Complex regional pain syndrome type 1 of right upper
extremity.

EXAM:
THREE-PHASE NM BONE SCAN AND SPECT/CT IMAGING
TECHNIQUE: Radionuclide angiographic images, immediate static blood pool
images, and 3-hour delayed static images were obtained of the thorax
after intravenous injection of radiopharmaceutical.
Delayed triplanar SPECT/CT images were obtained through the area of
interest.
RADIOPHARMACEUTICALS:  22.33 mCi Sc-RRm MDP IV

[Series 1000: bone (recon - ac ) · 4.8mm · 4.80mm/px · 6 of 80 frames shown]
[frame 7/80]
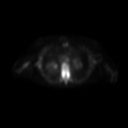
[frame 20/80]
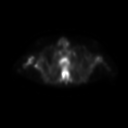
[frame 34/80]
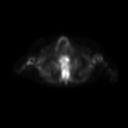
[frame 47/80]
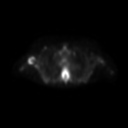
[frame 60/80]
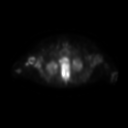
[frame 74/80]
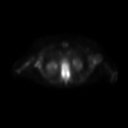

[6 of 6 positions shown; findings below may reference images not displayed]

FINDINGS: Vascular phase: No abnormal asymmetric increased blood flow to
either shoulder.

Blood pool phase: There is mild asymmetric soft tissue uptake
surrounding the right shoulder.

3 hour delayed phase: Intense radiotracer uptake localizes to the
right glenoid at the attachment of the glenosphere.

Moderate increased radiotracer uptake within the proximal right
humerus surrounding the entire humeral component of the right
shoulder arthroplasty device.

Degenerative type changes are noted involving the left glenohumeral
joint. Multi level degenerative type uptake is noted within the
cervical and thoracic spine.
IMPRESSION: 1. No signs of asymmetric increased blood flow to the right shoulder
to suggest arthroplasty device loosening or infection.
2. Increased blood pool and delayed phase activity surrounds the
humeral component of the right shoulder arthroplasty device is well
as the right glenoid posterior to the glenosphere. In the early
postoperative time frame this is considered nonspecific.
3. Left shoulder osteoarthritis and multilevel cervical and thoracic
degenerative disc disease.

## 2021-05-30 ENCOUNTER — Ambulatory Visit
Admission: RE | Admit: 2021-05-30 | Discharge: 2021-05-30 | Disposition: A | Discharge status: Home / Self Care | Payer: Medicare Other | Source: Ambulatory Visit | Attending: Student in an Organized Health Care Education/Training Program | Admitting: Student in an Organized Health Care Education/Training Program

## 2021-05-30 ENCOUNTER — Other Ambulatory Visit: Payer: Self-pay

## 2021-05-30 ENCOUNTER — Encounter: Payer: Self-pay | Admitting: Student in an Organized Health Care Education/Training Program

## 2021-05-30 ENCOUNTER — Ambulatory Visit (HOSPITAL_BASED_OUTPATIENT_CLINIC_OR_DEPARTMENT_OTHER): Payer: Medicare Other | Admitting: Student in an Organized Health Care Education/Training Program

## 2021-05-30 VITALS — BP 122/78 | HR 66 | Temp 97.0°F | Resp 16 | Ht 64.0 in | Wt 164.0 lb

## 2021-05-30 DIAGNOSIS — G8929 Other chronic pain: Secondary | ICD-10-CM | POA: Insufficient documentation

## 2021-05-30 DIAGNOSIS — M25511 Pain in right shoulder: Secondary | ICD-10-CM

## 2021-05-30 DIAGNOSIS — M12811 Other specific arthropathies, not elsewhere classified, right shoulder: Secondary | ICD-10-CM | POA: Insufficient documentation

## 2021-05-30 DIAGNOSIS — G894 Chronic pain syndrome: Secondary | ICD-10-CM | POA: Insufficient documentation

## 2021-05-30 MED ORDER — ROPIVACAINE HCL 2 MG/ML IJ SOLN
4.0000 mL | Freq: Once | INTRAMUSCULAR | Status: AC
  Administered 2021-05-30: 20 mL via INTRA_ARTICULAR

## 2021-05-30 MED ORDER — MIDAZOLAM HCL 5 MG/5ML IJ SOLN
0.5000 mg | Freq: Once | INTRAMUSCULAR | Status: AC
  Administered 2021-05-30: 1.5 mg via INTRAVENOUS
  Filled 2021-05-30: qty 5

## 2021-05-30 MED ORDER — IOHEXOL 180 MG/ML  SOLN
10.0000 mL | Freq: Once | INTRAMUSCULAR | Status: AC
  Administered 2021-05-30: 5 mL via INTRA_ARTICULAR
  Filled 2021-05-30: qty 20

## 2021-05-30 MED ORDER — LIDOCAINE HCL 2 % IJ SOLN
20.0000 mL | Freq: Once | INTRAMUSCULAR | Status: AC
  Administered 2021-05-30: 100 mg
  Filled 2021-05-30: qty 20

## 2021-05-30 MED ORDER — DEXAMETHASONE SODIUM PHOSPHATE 10 MG/ML IJ SOLN
10.0000 mg | Freq: Once | INTRAMUSCULAR | Status: AC
  Administered 2021-05-30: 10 mg
  Filled 2021-05-30: qty 1

## 2021-05-30 NOTE — Progress Notes (Signed)
Safety precautions to be maintained throughout the outpatient stay will include: orient to surroundings, keep bed in low position, maintain call bell within reach at all times, provide assistance with transfer out of bed and ambulation.  

## 2021-05-30 NOTE — Patient Instructions (Signed)

## 2021-05-30 NOTE — Progress Notes (Signed)
PROVIDER NOTE: Interpretation of information contained herein should be left to medically-trained personnel. Specific patient instructions are provided elsewhere under "Patient Instructions" section of medical record. This document was created in part using STT-dictation technology, any transcriptional errors that may result from this process are unintentional.  Patient: Claudia Brennan Type: Established DOB: 05/04/1951 MRN: [id number] PCP: Eldridge AbrahamsBoston, Karen Alicia, MD  Service: Procedure DOS: 05/30/2021 Setting: Ambulatory Location: Ambulatory outpatient facility Delivery: Face-to-face Provider: Edward JollyBilal Sherrice Creekmore, MD Specialty: Interventional Pain Management Specialty designation: 09 Location: Outpatient facility Ref. Prov.: [place], [place] BailiffKaren Alicia, MD    Primary Reason for Visit: Interventional Pain Management Treatment. CC: Shoulder Pain (right)  Procedure:           Type: Suprascapular nerve block #2 (#1 done with Dr Laban EmperorNaveira)  Laterality:  Right Level: Superior to scapular spine, lateral to supraspinatus fossa (Suprascapular notch).  Purpose: Diagnostic/Therapeutic Date: 05/30/2021 Performed by: Edward JollyBilal Keyani Rigdon, MD Anesthesia: Local (1-2% Lidocaine)  Anxiolysis: IV (1.5 mg Versed x1) Sedation: Minimal  Guidance: Fluoroscopy          Indications: Shoulder pain, severe enough to impact quality of life and/or function. 1. Rotator cuff arthropathy of shoulder (Right)   2. Chronic shoulder pain (Right)   3. Chronic pain syndrome    NAS-11 score:   Pre-procedure: 2 /10   Post-procedure: 4 /10    Target: Suprascapular nerve Location: midway between the medial border of the scapula and the acromion as it runs through the suprascapular notch. Region: Suprascapular, posterior shoulder  Approach: Percutaneous  Neuroanatomy: The suprascapular nerve is the lateral branch of the superior trunk of the brachial plexus. It receives nerve fibers that originate in the nerve roots C5 and C6 (and sometimes  C4). It is a mixed nerve, meaning that it provides both sensory and motor supply for the suprascapular region. Function: The main function of this nerve is to provide motor innervation for two muscles, the supraspinatus and infraspinatus muscles. They are part of the rotator cuff muscles. In addition, the suprascapular nerve provides a sensory supply to the joints of the scapula (glenohumeral and acromioclavicular joints). Rationale (medical necessity): procedure needed and proper for the diagnosis and/or treatment of the patient's medical symptoms and needs.  Position / Prep / Materials:  Position: Prone Materials:  Tray: Block Needle(s):  Type: Spinal  Gauge (G): 22  Length: 3.5 in.  Qty: 1 Prep solution: DuraPrep (Iodine Povacrylex [0.7% available iodine] and Isopropyl Alcohol, 74% w/w) Prep Area: Entire posterior shoulder area. From upper spine to shoulder proper (upper arm), and from lateral neck to lower tip of shoulder blade.   Pre-op H&P Assessment:  Ms. Linward Fosterettus is a 71 y.o. (year old), female patient, seen today for interventional treatment. She  has a past surgical history that includes Knee Arthroplasty and Shoulder arthroscopy with rotator cuff repair and open biceps tenodesis (Bilateral). Ms. Linward Fosterettus has a current medication list which includes the following prescription(s): atorvastatin, citalopram, diclofenac, hydrochlorothiazide, niacin, pantoprazole, pregabalin, topiramate, and trazodone. Her primarily concern today is the Shoulder Pain (right)  Initial Vital Signs:  Pulse/HCG Rate: 66ECG Heart Rate: 62 Temp: (!) 97 F (36.1 C) Resp: 16 BP: 108/70 SpO2: 99 %  BMI: Estimated body mass index is 28.15 kg/m as calculated from the following:   Height as of this encounter: 5\' 4"  (1.626 m).   Weight as of this encounter: 164 lb (74.4 kg).  Risk Assessment: Allergies: Reviewed. She is allergic to penicillins.  Allergy Precautions: None required Coagulopathies: Reviewed. None  identified.  Blood-thinner  therapy: None at this time Active Infection(s): Reviewed. None identified. Ms. Dea is afebrile  Site Confirmation: Ms. Rhines was asked to confirm the procedure and laterality before marking the site Procedure checklist: Completed Consent: Before the procedure and under the influence of no sedative(s), amnesic(s), or anxiolytics, the patient was informed of the treatment options, risks and possible complications. To fulfill our ethical and legal obligations, as recommended by the American Medical Association's Code of Ethics, I have informed the patient of my clinical impression; the nature and purpose of the treatment or procedure; the risks, benefits, and possible complications of the intervention; the alternatives, including doing nothing; the risk(s) and benefit(s) of the alternative treatment(s) or procedure(s); and the risk(s) and benefit(s) of doing nothing. The patient was provided information about the general risks and possible complications associated with the procedure. These may include, but are not limited to: failure to achieve desired goals, infection, bleeding, organ or nerve damage, allergic reactions, paralysis, and death. In addition, the patient was informed of those risks and complications associated to the procedure, such as failure to decrease pain; infection; bleeding; organ or nerve damage with subsequent damage to sensory, motor, and/or autonomic systems, resulting in permanent pain, numbness, and/or weakness of one or several areas of the body; allergic reactions; (i.e.: anaphylactic reaction); and/or death. Furthermore, the patient was informed of those risks and complications associated with the medications. These include, but are not limited to: allergic reactions (i.e.: anaphylactic or anaphylactoid reaction(s)); adrenal axis suppression; blood sugar elevation that in diabetics may result in ketoacidosis or comma; water retention that in patients  with history of congestive heart failure may result in shortness of breath, pulmonary edema, and decompensation with resultant heart failure; weight gain; swelling or edema; medication-induced neural toxicity; particulate matter embolism and blood vessel occlusion with resultant organ, and/or nervous system infarction; and/or aseptic necrosis of one or more joints. Finally, the patient was informed that Medicine is not an exact science; therefore, there is also the possibility of unforeseen or unpredictable risks and/or possible complications that may result in a catastrophic outcome. The patient indicated having understood very clearly. We have given the patient no guarantees and we have made no promises. Enough time was given to the patient to ask questions, all of which were answered to the patient's satisfaction. Ms. Szydlowski has indicated that she wanted to continue with the procedure. Attestation: I, the ordering provider, attest that I have discussed with the patient the benefits, risks, side-effects, alternatives, likelihood of achieving goals, and potential problems during recovery for the procedure that I have provided informed consent. Date   Time: 05/30/2021  8:06 AM  Pre-Procedure Preparation:  Monitoring: As per clinic protocol. Respiration, ETCO2, SpO2, BP, heart rate and rhythm monitor placed and checked for adequate function Safety Precautions: Patient was assessed for positional comfort and pressure points before starting the procedure. Time-out: I initiated and conducted the "Time-out" before starting the procedure, as per protocol. The patient was asked to participate by confirming the accuracy of the "Time Out" information. Verification of the correct person, site, and procedure were performed and confirmed by me, the nursing staff, and the patient. "Time-out" conducted as per Joint Commission's Universal Protocol (UP.01.01.01). Time: 0849  Description of Procedure:          Procedural  Technique Safety Precautions: Aspiration looking for blood return was conducted prior to all injections. At no point did we inject any substances, as a needle was being advanced. No attempts were made at seeking  any paresthesias. Safe injection practices and needle disposal techniques used. Medications properly checked for expiration dates. SDV (single dose vial) medications used. Description of the Procedure: Protocol guidelines were followed. The patient was placed in position over the procedure table. The target area was identified and the area prepped in the usual manner. Skin & deeper tissues infiltrated with local anesthetic. Appropriate amount of time allowed to pass for local anesthetics to take effect. The procedure needles were then advanced to the target area. Proper needle placement secured. Negative aspiration confirmed. Solution injected in intermittent fashion, asking for systemic symptoms every 0.5cc of injectate. The needles were then removed and the area cleansed, making sure to leave some of the prepping solution back to take advantage of its long term bactericidal properties.  Vitals:   05/30/21 0810 05/30/21 0845 05/30/21 0850 05/30/21 0900  BP: 108/70 97/78 111/79 122/78  Pulse: 66     Resp: 16 16 14 16   Temp: (!) 97 F (36.1 C)     SpO2: 99% 99% 100% 95%  Weight: 164 lb (74.4 kg)     Height: 5\' 4"  (1.626 m)       Start Time: 0849 hrs. End Time: 0852 hrs. Materials:  Needle(s) Type: Spinal Needle Gauge: 22G Length: 3.5-in Medication(s): Please see orders for medications and dosing details.  6 cc solution made of 5 cc of 0.2% ropivacaine, 1 cc of Decadron 10 mg/cc. Injected after contrast confirmation highlighting suprascapular notch.  Imaging Guidance (Non-Spinal):          Type of Imaging Technique: Fluoroscopy Guidance (Non-Spinal) Indication(s): Assistance in needle guidance and placement for procedures requiring needle placement in or near specific anatomical  locations not easily accessible without such assistance. Exposure Time: Please see nurses notes. Contrast: Before injecting any contrast, we confirmed that the patient did not have an allergy to iodine, shellfish, or radiological contrast. Once satisfactory needle placement was completed at the desired level, radiological contrast was injected. Contrast injected under live fluoroscopy. No contrast complications. See chart for type and volume of contrast used. Fluoroscopic Guidance: I was personally present during the use of fluoroscopy. "Tunnel Vision Technique" used to obtain the best possible view of the target area. Parallax error corrected before commencing the procedure. "Direction-depth-direction" technique used to introduce the needle under continuous pulsed fluoroscopy. Once target was reached, antero-posterior, oblique, and lateral fluoroscopic projection used confirm needle placement in all planes. Images permanently stored in EMR. Interpretation: I personally interpreted the imaging intraoperatively. Adequate needle placement confirmed in multiple planes. Appropriate spread of contrast into desired area was observed. No evidence of afferent or efferent intravascular uptake. Permanent images saved into the patient's record.   Post-operative Assessment:  Post-procedure Vital Signs:  Pulse/HCG Rate: 6665 Temp:  (!) 97 F (36.1 C) Resp: 16 BP: 122/78 SpO2: 95 %  EBL: None  Complications: No immediate post-treatment complications observed by team, or reported by patient.  Note: The patient tolerated the entire procedure well. A repeat set of vitals were taken after the procedure and the patient was kept under observation following institutional policy, for this type of procedure. Post-procedural neurological assessment was performed, showing return to baseline, prior to discharge. The patient was provided with post-procedure discharge instructions, including a section on how to identify  potential problems. Should any problems arise concerning this procedure, the patient was given instructions to immediately contact , at any time, without hesitation. In any case, we plan to contact the patient by telephone for a follow-up status report regarding  this interventional procedure.  Comments:  No additional relevant information.  Plan of Care  Orders:  Orders Placed This Encounter  Procedures   DG PAIN CLINIC C-ARM 1-60 MIN NO REPORT    Intraoperative interpretation by procedural physician at [facility].    Standing Status:   Standing    Number of Occurrences:   1    Order Specific Question:   Reason for exam:    Answer:   Assistance in needle guidance and placement for procedures requiring needle placement in or near specific anatomical locations not easily accessible without such assistance.    Medications ordered for procedure: Meds ordered this encounter  Medications   iohexol (OMNIPAQUE) 180 MG/ML injection 10 mL    Must be Myelogram-compatible. If not available, you may substitute with a water-soluble, non-ionic, hypoallergenic, myelogram-compatible radiological contrast medium.   lidocaine (XYLOCAINE) 2 % (with pres) injection 400 mg   dexamethasone (DECADRON) injection 10 mg   ropivacaine (PF) 2 mg/mL (0.2%) (NAROPIN) injection 4 mL   midazolam (VERSED) 5 MG/5ML injection 0.5-2 mg    Make sure Flumazenil is available in the pyxis when using this medication. If oversedation occurs, administer 0.2 mg IV over 15 sec. If after 45 sec no response, administer 0.2 mg again over 1 min; may repeat at 1 min intervals; not to exceed 4 doses (1 mg)   Medications administered: We administered iohexol, lidocaine, dexamethasone, ropivacaine (PF) 2 mg/mL (0.2%), and midazolam.  See the medical record for exact dosing, route, and time of administration.  Follow-up plan:   Return in about 4 weeks (around 06/27/2021) for Post Procedure Evaluation, virtual.      Recent  Visits Date Type Provider Dept  04/23/21 Procedure visit Edward Jolly, MD [facility]  04/03/21 Office Visit Edward Jolly, MD [facility]  03/01/21 Office Visit Edward Jolly, MD [facility]  Showing recent visits within past 90 days and meeting all other requirements Today's Visits Date Type Provider Dept  05/30/21 Procedure visit Edward Jolly, MD [facility]  Showing today's visits and meeting all other requirements Future Appointments Date Type Provider Dept  06/27/21 Appointment Edward Jolly, MD [facility]  Showing future appointments within next 90 days and meeting all other requirements  Disposition: Discharge home  Discharge (Date   Time): 05/30/2021; 0906 hrs.   Primary Care Physician: Annia Belt [place] Bailiff, MD Location: [facility] Note by: Edward Jolly, MD Date: 05/30/2021; Time: 9:36 AM  Disclaimer:  Medicine is not an exact science. The only guarantee in medicine is that nothing is guaranteed. It is important to note that the decision to proceed with this intervention was based on the information collected from the patient. The Data and conclusions were drawn from the patient's questionnaire, the interview, and the physical examination. Because the information was provided in large part by the patient, it cannot be guaranteed that it has not been purposely or unconsciously manipulated. Every effort has been made to obtain as much relevant data as possible for this evaluation. It is important to note that the conclusions that lead to this procedure are derived in large part from the available data. Always take into account that the treatment will also be dependent on availability of resources and existing treatment guidelines, considered by other Pain Management Practitioners as being common knowledge and practice, at the time of the intervention. For Medico-Legal purposes, it is also important to  point out that variation in procedural techniques and pharmacological  choices are the acceptable norm. The indications, contraindications, technique, and results of the above procedure should only be interpreted and judged by a Board-Certified Interventional Pain Specialist with extensive familiarity and expertise in the same exact procedure and technique.

## 2021-05-31 ENCOUNTER — Telehealth: Payer: Self-pay | Admitting: *Deleted

## 2021-05-31 NOTE — Telephone Encounter (Signed)
Denies any post procedure issues. 

## 2021-06-04 ENCOUNTER — Telehealth: Payer: Medicare Other | Admitting: Student in an Organized Health Care Education/Training Program

## 2021-06-12 ENCOUNTER — Other Ambulatory Visit: Payer: Self-pay | Admitting: Student in an Organized Health Care Education/Training Program

## 2021-06-26 ENCOUNTER — Telehealth: Payer: Self-pay

## 2021-06-26 NOTE — Telephone Encounter (Signed)
LM for patient to call office for pre virtual appointment questions.  

## 2021-06-27 ENCOUNTER — Ambulatory Visit
Payer: Medicare Other | Attending: Student in an Organized Health Care Education/Training Program | Admitting: Student in an Organized Health Care Education/Training Program

## 2021-06-27 ENCOUNTER — Other Ambulatory Visit: Payer: Self-pay

## 2021-06-27 ENCOUNTER — Encounter: Payer: Self-pay | Admitting: Student in an Organized Health Care Education/Training Program

## 2021-06-27 DIAGNOSIS — M12812 Other specific arthropathies, not elsewhere classified, left shoulder: Secondary | ICD-10-CM

## 2021-06-27 DIAGNOSIS — G894 Chronic pain syndrome: Secondary | ICD-10-CM

## 2021-06-27 DIAGNOSIS — M12811 Other specific arthropathies, not elsewhere classified, right shoulder: Secondary | ICD-10-CM | POA: Diagnosis not present

## 2021-06-27 DIAGNOSIS — G8929 Other chronic pain: Secondary | ICD-10-CM

## 2021-06-27 DIAGNOSIS — M25519 Pain in unspecified shoulder: Secondary | ICD-10-CM

## 2021-06-27 DIAGNOSIS — M25511 Pain in right shoulder: Secondary | ICD-10-CM | POA: Diagnosis not present

## 2021-06-27 DIAGNOSIS — Z96619 Presence of unspecified artificial shoulder joint: Secondary | ICD-10-CM

## 2021-06-27 NOTE — Progress Notes (Signed)
Patient: Claudia Brennan  Service Category: E/M  Provider: Gillis Santa, MD  DOB: April 21, 1951  DOS: 06/27/2021  Location: Office  MRN: [id number]  Setting: Ambulatory outpatient  Referring Provider: Irish Elders, MD  Type: Established Patient  Specialty: Interventional Pain Management  PCP: Irish Elders, MD  Location: Remote location  Delivery: TeleHealth     Virtual Encounter - Pain Management PROVIDER NOTE: Information contained herein reflects review and annotations entered in association with encounter. Interpretation of such information and data should be left to medically-trained personnel. Information provided to patient can be located elsewhere in the medical record under "Patient Instructions". Document created using STT-dictation technology, any transcriptional errors that may result from process are unintentional.    Contact & Pharmacy Preferred: [phone] Home: [phone] (home) Mobile: [phone] (mobile) E-mail: [email or web address]  [id number] [place] [id number]: [phone]: [phone]  Pre-screening  Claudia Brennan offered "in-person" vs "virtual" encounter. She indicated preferring virtual for this encounter.   Reason COVID-19*   Social distancing based on CDC and AMA recommendations.   I contacted Claudia Zellneron 06/27/2021 via telephone.      I clearly identified myself as BGillis Santa MD. I verified that I was speaking with the correct person using two identifiers (Name: Claudia Brennan and date of birth: [id number].  Consent I sought verbal advanced consent from Claudia Filbertfor virtual visit interactions. I informed Claudia Brennan of possible security and privacy concerns, risks, and limitations associated with providing "not-in-person" medical evaluation and management services. I also informed Claudia Brennan of the availability of "in-person" appointments. Finally, I informed her that there  would be a charge for the virtual visit and that she could be  personally, fully or partially, financially responsible for it. Claudia Brennan understanding and agreed to proceed.   Historic Elements   Ms. DFontaine Kossmanis a 71y.o. year old, female patient evaluated today after our last contact on 06/12/2021. Claudia Brennan has a past medical history of Arthritis, Cataract, GERD (gastroesophageal reflux disease), and Hyperlipidemia. She also  has a past surgical history that includes Knee Arthroplasty and Shoulder arthroscopy with rotator cuff repair and open biceps tenodesis (Bilateral). Ms. PAkardhas a current medication list which includes the following prescription(s): atorvastatin, citalopram, diclofenac, hydrochlorothiazide, niacin, pantoprazole, pregabalin, topiramate, and trazodone. She  reports that she has quit smoking. Her smoking use included cigarettes. She has never used smokeless tobacco. She reports that she does not use drugs. No history on file for alcohol use. Ms. PBableris allergic to penicillins.   HPI  Today, she is being contacted for a post-procedure assessment.   Post-procedure evaluation    Type: Suprascapular nerve block #2 (#1 done with Dr NDossie Arbour  Laterality:  Right Level: Superior to scapular spine, lateral to supraspinatus fossa (Suprascapular notch).  Purpose: Diagnostic/Therapeutic Date: 05/30/2021 Performed by: BGillis Santa MD Anesthesia: Local (1-2% Lidocaine)  Anxiolysis: IV (1.5 mg Versed x1) Sedation: Minimal  Guidance: Fluoroscopy          Indications: Shoulder pain, severe enough to impact quality of life and/or function. 1. Rotator cuff arthropathy of shoulder (Right)   2. Chronic shoulder pain (Right)   3. Chronic pain syndrome    NAS-11 score:   Pre-procedure: 2 /10   Post-procedure: 4 /10     Effectiveness:  Initial hour after procedure: 100 %  Subsequent 4-6 hours post-procedure: 100 %  Analgesia past initial 6 hours:  100 % (lasting  3 weeks)  Ongoing improvement:  Analgesic:  75% until 3 days ago Function: Claudia Brennan reports improvement in function    Laboratory Chemistry Profile   Renal Lab Results  Component Value Date   BUN 14 12/22/2019   CREATININE 0.83 12/22/2019   GFRAA >60 12/22/2019   GFRNONAA >60 12/22/2019    Hepatic Lab Results  Component Value Date   AST 15 12/22/2019   ALT 17 12/22/2019   ALBUMIN 4.2 12/22/2019   ALKPHOS 79 12/22/2019    Electrolytes Lab Results  Component Value Date   NA 142 12/22/2019   K 3.8 12/22/2019   CL 105 12/22/2019   CALCIUM 10.5 (H) 12/22/2019    Bone No results found for: VD25OH, VD125OH2TOT, EA8350VD7, BA2567CS9, 25OHVITD1, 25OHVITD2, 25OHVITD3, TESTOFREE, TESTOSTERONE  Inflammation (CRP: Acute Phase) (ESR: Chronic Phase) No results found for: CRP, ESRSEDRATE, LATICACIDVEN       Note: Above Lab results reviewed.   Assessment  The primary encounter diagnosis was Rotator cuff arthropathy of shoulder (Right). Diagnoses of Rotator cuff arthropathy of both shoulders, Pain in shoulder region after shoulder replacement (Right), Chronic shoulder pain (Right), and Chronic pain syndrome were also pertinent to this visit.  Plan of Care  Positive analgesic and functional response after right suprascapular nerve block performed 05/30/2021.  Notes pain relief at 100% for 3 weeks.  She volunteered with the [place] and was more active and using her shoulders more.  She states that her pain is now increasing and she is having new onset pain in her left shoulder that is similar to what she was experiencing in her right shoulder.  We discussed repeating bilateral suprascapular nerve block.  Risks and benefits reviewed.  Future considerations include pulsed radiofrequency ablation of the suprascapular nerve.   Orders:  Orders Placed This Encounter  Procedures   SUPRASCAPULAR NERVE BLOCK    For shoulder pain.    Standing Status:   Future    Standing Expiration Date:    09/24/2021    Scheduling Instructions:     Purpose: Diagnostic     Laterality: Bilateral     Level(s): Suprascapular notch     Sedation: IV Versed     Scheduling Timeframe: As permitted by the schedule    Order Specific Question:   Where will this procedure be performed?    Answer:   [facility]   Follow-up plan:   Return in about 2 weeks (around 07/11/2021) for B/L SSNB , in clinic IV Versed.    Recent Visits Date Type Provider Dept  05/30/21 Procedure visit Gillis Santa, MD [facility]  04/23/21 Procedure visit Gillis Santa, MD [facility]  04/03/21 Office Visit Gillis Santa, MD [facility]  Showing recent visits within past 90 days and meeting all other requirements Today's Visits Date Type Provider Dept  06/27/21 Office Visit Gillis Santa, MD [facility]  Showing today's visits and meeting all other requirements Future Appointments No visits were found meeting these conditions. Showing future appointments within next 90 days and meeting all other requirements  I discussed the assessment and treatment plan with the patient. The patient was provided an opportunity to ask questions and all were answered. The patient agreed with the plan and demonstrated an understanding of the instructions.  Patient advised to call back or seek an in-person evaluation if the symptoms or condition worsens.  Duration of encounter: 6mnutes.  Note by: BGillis Santa MD Date: 06/27/2021; Time: 11:14 AM

## 2021-07-11 ENCOUNTER — Ambulatory Visit: Payer: Medicare Other | Admitting: Student in an Organized Health Care Education/Training Program

## 2021-07-18 ENCOUNTER — Other Ambulatory Visit: Payer: Self-pay | Admitting: *Deleted

## 2021-07-18 ENCOUNTER — Ambulatory Visit
Admission: RE | Admit: 2021-07-18 | Discharge: 2021-07-18 | Disposition: A | Discharge status: Home / Self Care | Payer: Medicare Other | Source: Ambulatory Visit | Attending: Student in an Organized Health Care Education/Training Program | Admitting: Student in an Organized Health Care Education/Training Program

## 2021-07-18 ENCOUNTER — Other Ambulatory Visit: Payer: Self-pay

## 2021-07-18 ENCOUNTER — Ambulatory Visit (HOSPITAL_BASED_OUTPATIENT_CLINIC_OR_DEPARTMENT_OTHER): Payer: Medicare Other | Admitting: Student in an Organized Health Care Education/Training Program

## 2021-07-18 ENCOUNTER — Encounter: Payer: Self-pay | Admitting: Student in an Organized Health Care Education/Training Program

## 2021-07-18 DIAGNOSIS — G894 Chronic pain syndrome: Secondary | ICD-10-CM | POA: Diagnosis present

## 2021-07-18 DIAGNOSIS — M25511 Pain in right shoulder: Secondary | ICD-10-CM | POA: Diagnosis present

## 2021-07-18 DIAGNOSIS — G8929 Other chronic pain: Secondary | ICD-10-CM | POA: Insufficient documentation

## 2021-07-18 DIAGNOSIS — M12811 Other specific arthropathies, not elsewhere classified, right shoulder: Secondary | ICD-10-CM | POA: Diagnosis present

## 2021-07-18 DIAGNOSIS — Z96619 Presence of unspecified artificial shoulder joint: Secondary | ICD-10-CM

## 2021-07-18 DIAGNOSIS — M25519 Pain in unspecified shoulder: Secondary | ICD-10-CM | POA: Diagnosis present

## 2021-07-18 DIAGNOSIS — M12812 Other specific arthropathies, not elsewhere classified, left shoulder: Secondary | ICD-10-CM | POA: Insufficient documentation

## 2021-07-18 MED ORDER — DEXAMETHASONE SODIUM PHOSPHATE 10 MG/ML IJ SOLN
10.0000 mg | Freq: Once | INTRAMUSCULAR | Status: AC
  Administered 2021-07-18: 10 mg

## 2021-07-18 MED ORDER — DEXAMETHASONE SODIUM PHOSPHATE 10 MG/ML IJ SOLN
INTRAMUSCULAR | Status: AC
  Filled 2021-07-18: qty 2

## 2021-07-18 MED ORDER — IOHEXOL 180 MG/ML  SOLN
10.0000 mL | Freq: Once | INTRAMUSCULAR | Status: AC
  Administered 2021-07-18: 5 mL via INTRA_ARTICULAR

## 2021-07-18 MED ORDER — MIDAZOLAM HCL 5 MG/5ML IJ SOLN
0.5000 mg | Freq: Once | INTRAMUSCULAR | Status: AC
  Administered 2021-07-18: 1.5 mg via INTRAVENOUS

## 2021-07-18 MED ORDER — MIDAZOLAM HCL 5 MG/5ML IJ SOLN
INTRAMUSCULAR | Status: AC
  Filled 2021-07-18: qty 5

## 2021-07-18 MED ORDER — ROPIVACAINE HCL 2 MG/ML IJ SOLN
INTRAMUSCULAR | Status: AC
  Filled 2021-07-18: qty 20

## 2021-07-18 MED ORDER — ROPIVACAINE HCL 2 MG/ML IJ SOLN
9.0000 mL | Freq: Once | INTRAMUSCULAR | Status: AC
  Administered 2021-07-18: 9 mL via PERINEURAL

## 2021-07-18 MED ORDER — LIDOCAINE HCL 2 % IJ SOLN
INTRAMUSCULAR | Status: AC
  Filled 2021-07-18: qty 20

## 2021-07-18 MED ORDER — LIDOCAINE HCL 2 % IJ SOLN
20.0000 mL | Freq: Once | INTRAMUSCULAR | Status: AC
  Administered 2021-07-18: 400 mg

## 2021-07-18 NOTE — Patient Instructions (Signed)

## 2021-07-18 NOTE — Progress Notes (Signed)
PROVIDER NOTE: Interpretation of information contained herein should be left to medically-trained personnel. Specific patient instructions are provided elsewhere under "Patient Instructions" section of medical record. This document was created in part using STT-dictation technology, any transcriptional errors that may result from this process are unintentional.  Patient: Claudia Brennan Type: Established DOB: 1951/02/27 MRN: [id number] PCP: Eldridge Abrahams, MD  Service: Procedure DOS: 07/18/2021 Setting: Ambulatory Location: Ambulatory outpatient facility Delivery: Face-to-face Provider: Edward Jolly, MD Specialty: Interventional Pain Management Specialty designation: 09 Location: Outpatient facility Ref. Prov.: Edward Jolly, MD    Primary Reason for Visit: Interventional Pain Management Treatment. CC: Shoulder Pain (Bilateral shoulder )  Procedure:           Type: Suprascapular nerve block #3  Laterality:  Bilateral Level: Superior to scapular spine, lateral to supraspinatus fossa (Suprascapular notch).  Purpose: Therapeutic Date: 07/18/2021 Performed by: Edward Jolly, MD Anesthesia: Local (1-2% Lidocaine)  Anxiolysis: IV Versed 1.5 mg   Sedation: Minimal  Guidance: Fluoroscopy          Indications: Shoulder pain, severe enough to impact quality of life and/or function. 1. Rotator cuff arthropathy of shoulder (Right)   2. Chronic shoulder pain (Right)   3. Chronic pain syndrome   4. Rotator cuff arthropathy of both shoulders   5. Pain in shoulder region after shoulder replacement (Right)    NAS-11 score:   Pre-procedure: 4 /10   Post-procedure: 0-No pain/10     Target: Suprascapular nerve Location: midway between the medial border of the scapula and the acromion as it runs through the suprascapular notch. Region: Suprascapular, posterior shoulder  Approach: Percutaneous  Neuroanatomy: The suprascapular nerve is the lateral branch of the superior trunk of the brachial  plexus. It receives nerve fibers that originate in the nerve roots C5 and C6 (and sometimes C4). It is a mixed nerve, meaning that it provides both sensory and motor supply for the suprascapular region. Function: The main function of this nerve is to provide motor innervation for two muscles, the supraspinatus and infraspinatus muscles. They are part of the rotator cuff muscles. In addition, the suprascapular nerve provides a sensory supply to the joints of the scapula (glenohumeral and acromioclavicular joints). Rationale (medical necessity): procedure needed and proper for the diagnosis and/or treatment of the patient's medical symptoms and needs.  Position / Prep / Materials:  Position: Prone Materials:  Tray: Block Needle(s):  Type: Spinal  Gauge (G): 25  Length: 3.5 in.  Qty: 1 Prep solution: DuraPrep (Iodine Povacrylex [0.7% available iodine] and Isopropyl Alcohol, 74% w/w) Prep Area: Entire posterior shoulder area. From upper spine to shoulder proper (upper arm), and from lateral neck to lower tip of shoulder blade.   Pre-op H&P Assessment:  Claudia Brennan is a 71 y.o. (year old), female patient, seen today for interventional treatment. She  has a past surgical history that includes Knee Arthroplasty and Shoulder arthroscopy with rotator cuff repair and open biceps tenodesis (Bilateral). Claudia Brennan has a current medication list which includes the following prescription(s): atorvastatin, citalopram, diclofenac, hydrochlorothiazide, niacin, nortriptyline, pantoprazole, pregabalin, topiramate, and trazodone. Her primarily concern today is the Shoulder Pain (Bilateral shoulder )  Initial Vital Signs:  Pulse/HCG Rate: 73ECG Heart Rate: 71 Temp: (!) 97.2 F (36.2 C) Resp: 16 BP: 127/87 SpO2: 100 %  BMI: Estimated body mass index is 27.46 kg/m as calculated from the following:   Height as of this encounter: 5\' 4"  (1.626 m).   Weight as of this encounter: 160 lb (72.6 kg).  Risk  Assessment: Allergies: Reviewed. She is allergic to penicillins.  Allergy Precautions: None required Coagulopathies: Reviewed. None identified.  Blood-thinner therapy: None at this time Active Infection(s): Reviewed. None identified. Claudia Brennan is afebrile  Site Confirmation: Claudia Brennan was asked to confirm the procedure and laterality before marking the site Procedure checklist: Completed Consent: Before the procedure and under the influence of no sedative(s), amnesic(s), or anxiolytics, the patient was informed of the treatment options, risks and possible complications. To fulfill our ethical and legal obligations, as recommended by the American Medical Association's Code of Ethics, I have informed the patient of my clinical impression; the nature and purpose of the treatment or procedure; the risks, benefits, and possible complications of the intervention; the alternatives, including doing nothing; the risk(s) and benefit(s) of the alternative treatment(s) or procedure(s); and the risk(s) and benefit(s) of doing nothing. The patient was provided information about the general risks and possible complications associated with the procedure. These may include, but are not limited to: failure to achieve desired goals, infection, bleeding, organ or nerve damage, allergic reactions, paralysis, and death. In addition, the patient was informed of those risks and complications associated to the procedure, such as failure to decrease pain; infection; bleeding; organ or nerve damage with subsequent damage to sensory, motor, and/or autonomic systems, resulting in permanent pain, numbness, and/or weakness of one or several areas of the body; allergic reactions; (i.e.: anaphylactic reaction); and/or death. Furthermore, the patient was informed of those risks and complications associated with the medications. These include, but are not limited to: allergic reactions (i.e.: anaphylactic or anaphylactoid reaction(s));  adrenal axis suppression; blood sugar elevation that in diabetics may result in ketoacidosis or comma; water retention that in patients with history of congestive heart failure may result in shortness of breath, pulmonary edema, and decompensation with resultant heart failure; weight gain; swelling or edema; medication-induced neural toxicity; particulate matter embolism and blood vessel occlusion with resultant organ, and/or nervous system infarction; and/or aseptic necrosis of one or more joints. Finally, the patient was informed that Medicine is not an exact science; therefore, there is also the possibility of unforeseen or unpredictable risks and/or possible complications that may result in a catastrophic outcome. The patient indicated having understood very clearly. We have given the patient no guarantees and we have made no promises. Enough time was given to the patient to ask questions, all of which were answered to the patient's satisfaction. Claudia Brennan has indicated that she wanted to continue with the procedure. Attestation: I, the ordering provider, attest that I have discussed with the patient the benefits, risks, side-effects, alternatives, likelihood of achieving goals, and potential problems during recovery for the procedure that I have provided informed consent. Date   Time: 07/18/2021  8:24 AM  Pre-Procedure Preparation:  Monitoring: As per clinic protocol. Respiration, ETCO2, SpO2, BP, heart rate and rhythm monitor placed and checked for adequate function Safety Precautions: Patient was assessed for positional comfort and pressure points before starting the procedure. Time-out: I initiated and conducted the "Time-out" before starting the procedure, as per protocol. The patient was asked to participate by confirming the accuracy of the "Time Out" information. Verification of the correct person, site, and procedure were performed and confirmed by me, the nursing staff, and the patient.  "Time-out" conducted as per Joint Commission's Universal Protocol (UP.01.01.01). Time: 0900  Description of Procedure:          Procedural Technique Safety Precautions: Aspiration looking for blood return was conducted prior to all  injections. At no point did we inject any substances, as a needle was being advanced. No attempts were made at seeking any paresthesias. Safe injection practices and needle disposal techniques used. Medications properly checked for expiration dates. SDV (single dose vial) medications used. Description of the Procedure: Protocol guidelines were followed. The patient was placed in position over the procedure table. The target area was identified and the area prepped in the usual manner. Skin & deeper tissues infiltrated with local anesthetic. Appropriate amount of time allowed to pass for local anesthetics to take effect. The procedure needles were then advanced to the target area. Proper needle placement secured. Negative aspiration confirmed. Solution injected in intermittent fashion, asking for systemic symptoms every 0.5cc of injectate. The needles were then removed and the area cleansed, making sure to leave some of the prepping solution back to take advantage of its long term bactericidal properties.  Vitals:   07/18/21 0905 07/18/21 0910 07/18/21 0912 07/18/21 0920  BP: 129/77 (!) 149/85 136/78 134/84  Pulse:      Resp: 18 18 15 16   Temp:      TempSrc:      SpO2: 100% 98% 100% 100%  Weight:      Height:        Start Time: 0900 hrs. End Time: 0912 hrs. Materials:  Needle(s) Type: Spinal Needle Gauge: 22G Length: 3.5-in Medication(s): Please see orders for medications and dosing details.  5cc solution made of 4cc of 0.2% ropivacaine, 1 cc of Decadron 10 mg/cc. Injected along the right SSN after contrast confirmation highlighting suprascapular notch. 5cc solution made of 4cc of 0.2% ropivacaine, 1 cc of Decadron 10 mg/cc. Injected along the left SSN after  contrast confirmation highlighting suprascapular notch.  Imaging Guidance (Non-Spinal):          Type of Imaging Technique: Fluoroscopy Guidance (Non-Spinal) Indication(s): Assistance in needle guidance and placement for procedures requiring needle placement in or near specific anatomical locations not easily accessible without such assistance. Exposure Time: Please see nurses notes. Contrast: Before injecting any contrast, we confirmed that the patient did not have an allergy to iodine, shellfish, or radiological contrast. Once satisfactory needle placement was completed at the desired level, radiological contrast was injected. Contrast injected under live fluoroscopy. No contrast complications. See chart for type and volume of contrast used. Fluoroscopic Guidance: I was personally present during the use of fluoroscopy. "Tunnel Vision Technique" used to obtain the best possible view of the target area. Parallax error corrected before commencing the procedure. "Direction-depth-direction" technique used to introduce the needle under continuous pulsed fluoroscopy. Once target was reached, antero-posterior, oblique, and lateral fluoroscopic projection used confirm needle placement in all planes. Images permanently stored in EMR. Interpretation: I personally interpreted the imaging intraoperatively. Adequate needle placement confirmed in multiple planes. Appropriate spread of contrast into desired area was observed. No evidence of afferent or efferent intravascular uptake. Permanent images saved into the patient's record.   Post-operative Assessment:  Post-procedure Vital Signs:  Pulse/HCG Rate: 7370 Temp:  (!) 97.2 F (36.2 C) Resp: 16 BP: 134/84 SpO2: 100 %  EBL: None  Complications: No immediate post-treatment complications observed by team, or reported by patient.  Note: The patient tolerated the entire procedure well. A repeat set of vitals were taken after the procedure and the patient was  kept under observation following institutional policy, for this type of procedure. Post-procedural neurological assessment was performed, showing return to baseline, prior to discharge. The patient was provided with post-procedure discharge instructions, including a  section on how to identify potential problems. Should any problems arise concerning this procedure, the patient was given instructions to immediately contact us, at any time, without hesitation. In any case, we plan to contact the patient by telephone for a follow-up status report regarding this interventional procedure.  Comments:  No additional relevant information.  Plan of Care  Orders:  Orders Placed This Encounter  Procedures   DG PAIN CLIN[facility] C-ARM 1-60 MIN NO REPORT    Intraoperative interpretation by procedural physician at [facility].    Standing Status:   Standing    Number of Occurrences:   1    Order Specific Question:   Reason for exam:    Answer:   Assistance in needle guidance and placement for procedures requiring needle placement in or near specific anatomical locations not easily accessible without such assistance.    Medications ordered for procedure: Meds ordered this encounter  Medications   iohexol (OMNIPAQUE) 180 MG/ML injection 10 mL    Must be Myelogram-compatible. If not available, you may substitute with a water-soluble, non-ionic, hypoallergenic, myelogram-compatible radiological contrast medium.   lidocaine (XYLOCAINE) 2 % (with pres) injection 400 mg   midazolam (VERSED) 5 MG/5ML injection 0.5-2 mg    Make sure Flumazenil is available in the pyxis when using this medication. If oversedation occurs, administer 0.2 mg IV over 15 sec. If after 45 sec no response, administer 0.2 mg again over 1 min; may repeat at 1 min intervals; not to exceed 4 doses (1 mg)   dexamethasone (DECADRON) injection 10 mg   dexamethasone (DECADRON) injection 10 mg   ropivacaine (PF) 2 mg/mL (0.2%) (NAROPIN)  injection 9 mL   Medications administered: We administered iohexol, lidocaine, midazolam, dexamethasone, dexamethasone, and ropivacaine (PF) 2 mg/mL (0.2%).  See the medical record for exact dosing, route, and time of administration.  Follow-up plan:   Return in about 4 weeks (around 08/15/2021) for Post Procedure Evaluation, virtual.      Recent Visits Date Type Provider Dept  06/27/21 Office Visit Edward JollyLateef, Dontavian Marchi, MD [facility]  05/30/21 Procedure visit Edward JollyLateef, Alishea Beaudin, MD [facility]  04/23/21 Procedure visit Edward JollyLateef, Lataysha Vohra, MD [facility]  Showing recent visits within past 90 days and meeting all other requirements Today's Visits Date Type Provider Dept  07/18/21 Procedure visit Edward JollyLateef, Brendy Ficek, MD [facility]  Showing today's visits and meeting all other requirements Future Appointments Date Type Provider Dept  08/15/21 Appointment Edward JollyLateef, Vadie Principato, MD [facility]  Showing future appointments within next 90 days and meeting all other requirements  Disposition: Discharge home  Discharge (Date   Time): 07/18/2021; 0925 hrs.   Primary Care Physician: Annia BeltBoston, [place] BailiffKaren Alicia, MD [facility] Note by: Edward JollyBilal Yovan Leeman, MD Date: 07/18/2021; Time: 9:36 AM  Disclaimer:  Medicine is not an exact science. The only guarantee in medicine is that nothing is guaranteed. It is important to note that the decision to proceed with this intervention was based on the information collected from the patient. The Data and conclusions were drawn from the patient's questionnaire, the interview, and the physical examination. Because the information was provided in large part by the patient, it cannot be guaranteed that it has not been purposely or unconsciously manipulated. Every effort has been made to obtain as much relevant data as possible for this evaluation. It is important to note that the conclusions that lead to this procedure  are derived in large part from the available data. Always  take into account that the treatment will also be dependent on availability of resources and existing treatment guidelines, considered by other Pain Management Practitioners as being common knowledge and practice, at the time of the intervention. For Medico-Legal purposes, it is also important to point out that variation in procedural techniques and pharmacological choices are the acceptable norm. The indications, contraindications, technique, and results of the above procedure should only be interpreted and judged by a Board-Certified Interventional Pain Specialist with extensive familiarity and expertise in the same exact procedure and technique.

## 2021-07-18 NOTE — Progress Notes (Signed)
Safety precautions to be maintained throughout the outpatient stay will include: orient to surroundings, keep bed in low position, maintain call bell within reach at all times, provide assistance with transfer out of bed and ambulation.  

## 2021-07-19 ENCOUNTER — Telehealth: Payer: Self-pay | Admitting: *Deleted

## 2021-07-19 NOTE — Telephone Encounter (Signed)
No problems post procedure. 

## 2021-08-15 ENCOUNTER — Telehealth: Payer: Self-pay | Admitting: Student in an Organized Health Care Education/Training Program

## 2021-08-15 ENCOUNTER — Encounter: Payer: Self-pay | Admitting: Student in an Organized Health Care Education/Training Program

## 2021-08-15 ENCOUNTER — Other Ambulatory Visit: Payer: Self-pay

## 2021-08-15 ENCOUNTER — Ambulatory Visit
Payer: Medicare Other | Attending: Student in an Organized Health Care Education/Training Program | Admitting: Student in an Organized Health Care Education/Training Program

## 2021-08-15 DIAGNOSIS — G894 Chronic pain syndrome: Secondary | ICD-10-CM

## 2021-08-15 DIAGNOSIS — M12811 Other specific arthropathies, not elsewhere classified, right shoulder: Secondary | ICD-10-CM

## 2021-08-15 DIAGNOSIS — M25511 Pain in right shoulder: Secondary | ICD-10-CM

## 2021-08-15 DIAGNOSIS — G8929 Other chronic pain: Secondary | ICD-10-CM

## 2021-08-15 DIAGNOSIS — Z96619 Presence of unspecified artificial shoulder joint: Secondary | ICD-10-CM

## 2021-08-15 DIAGNOSIS — M12812 Other specific arthropathies, not elsewhere classified, left shoulder: Secondary | ICD-10-CM

## 2021-08-15 DIAGNOSIS — M25519 Pain in unspecified shoulder: Secondary | ICD-10-CM

## 2021-08-15 MED ORDER — NORTRIPTYLINE HCL 10 MG PO CAPS: 20.0000 mg | ORAL_CAPSULE | Freq: Every day | ORAL | 0 refills | Status: DC

## 2021-08-15 NOTE — Telephone Encounter (Signed)
Patient called pharmacy to fill new script, they told her she could not fill new script because she had just gotten one filled. She needs advise on how to take meds at the new instructions for the new script? Please call patient ?

## 2021-08-15 NOTE — Progress Notes (Signed)
Patient in [place] volunteering with [employer].  Will be there for the next 2 weeks.  ?

## 2021-08-15 NOTE — Progress Notes (Signed)
Patient: Claudia Brennan  Service Category: E/M  Provider: Gillis Santa, Brennan  ?DOB: 08-Dec-1950  DOS: 08/15/2021  Location: Office  ?MRN: [id number]  Setting: Ambulatory outpatient  Referring Provider: Franne Forts Dola Argyle, Brennan  ?Type: Established Patient  Specialty: Interventional Pain Management  PCP: Claudia Elders, Brennan  ?Location: Remote location  Delivery: TeleHealth    ? ?Virtual Encounter - Pain Management ?PROVIDER NOTE: Information contained herein reflects review and annotations entered in association with encounter. Interpretation of such information and data should be left to medically-trained personnel. Information provided to patient can be located elsewhere in the medical record under "Patient Instructions". Document created using STT-dictation technology, any transcriptional errors that may result from process are unintentional.  ?  ?Contact & Pharmacy ?Preferred: [phone] ?Home: [phone] (home) ?Mobile: [phone] (mobile) ?E-mail: [email or web address]  ?[id number] [place] [place] [address]?Phone: [phone]: [phone]?  ?Pre-screening  ?Claudia Brennan offered "in-person" vs "virtual" encounter. She indicated preferring virtual for this encounter.  ? ?Reason ?COVID-19*  Social distancing based on CDC and AMA recommendations.  ? ?I contacted Claudia Brennan 08/15/2021 via telephone.      I clearly identified myself as Claudia Brennan. I verified that I was speaking with the correct person using two identifiers (Name: Claudia Brennan and date of birth: [id number]. ? ?Consent ?I sought verbal advanced consent from Claudia Brennan virtual visit interactions. I informed Claudia Brennan of possible security and privacy concerns, risks, and limitations associated with providing "not-in-person" medical evaluation and management services. I also informed Claudia Brennan of the availability of "in-person" appointments. Finally, I informed her that  there would be a charge for the virtual visit and that she could be  personally, fully or partially, financially responsible for it. Claudia Brennan understanding and agreed to proceed.  ? ?Historic Elements   ?Ms. DMargurete Guamanis a 71y.o. year old, female patient evaluated today after our last contact on 07/18/2021. Claudia Brennan has a past medical history of Arthritis, Cataract, GERD (gastroesophageal reflux disease), and Hyperlipidemia. She also  has a past surgical history that includes Knee Arthroplasty and Shoulder arthroscopy with rotator cuff repair and open biceps tenodesis (Bilateral). [name] a current medication list which includes the following prescription(s): atorvastatin, citalopram, hydrochlorothiazide, niacin, pantoprazole, pregabalin, topiramate, trazodone, diclofenac, and nortriptyline. She  reports that she has quit smoking. Her smoking use included cigarettes. She has never used smokeless tobacco. She reports that she does not use drugs. No history on file for alcohol use. [name] allergic to penicillins.  ? ?HPI  ?Today, she is being contacted for a post-procedure assessment. ? ? ?Post-procedure evaluation  ?  Type: Suprascapular nerve block #3  ?Laterality:  Bilateral ?Level: Superior to scapular spine, lateral to supraspinatus fossa (Suprascapular notch).  ?Purpose: Therapeutic ?Date: 07/18/2021 ?Performed by: Claudia Brennan ?Anesthesia: Local (1-2% Lidocaine)  ?Anxiolysis: IV Versed 1.5 mg   ?Sedation: Minimal  ?Guidance: Fluoroscopy         ? ?Indications: Shoulder pain, severe enough to impact quality of life and/or function. ?1. Rotator cuff arthropathy of shoulder (Right)   ?2. Chronic shoulder pain (Right)   ?3. Chronic pain syndrome   ?4. Rotator cuff arthropathy of both shoulders   ?5. Pain in shoulder region after shoulder replacement (Right)   ? ?NAS-11 score:  ? Pre-procedure: 4 /10  ? Post-procedure: 0-No pain/10  ? ?   ?Effectiveness:  ?Initial hour  after  procedure: 100 %  ?Subsequent 4-6 hours post-procedure: 100 %  ?Analgesia past initial 6 hours: 50 % (patient states that the procedure wore off really quickly, by that evening and she experienced pain all night.)  ?Ongoing improvement:  ?Analgesic:  10-20% ?Function: Back to baseline ?ROM: Back to baseline ? ? ?Laboratory Chemistry Profile  ? ?Renal ?Lab Results  ?Component Value Date  ? BUN 14 12/22/2019  ? CREATININE 0.83 12/22/2019  ? GFRAA >60 12/22/2019  ? GFRNONAA >60 12/22/2019  ?  Hepatic ?Lab Results  ?Component Value Date  ? AST 15 12/22/2019  ? ALT 17 12/22/2019  ? ALBUMIN 4.2 12/22/2019  ? ALKPHOS 79 12/22/2019  ?  ?Electrolytes ?Lab Results  ?Component Value Date  ? NA 142 12/22/2019  ? K 3.8 12/22/2019  ? CL 105 12/22/2019  ? CALCIUM 10.5 (H) 12/22/2019  ?  Bone ?No results found for: [place], [id number], [id number], [id number], 25OHVITD1, 25OHVITD2, 25OHVITD3, TESTOFREE, TESTOSTERONE  ?Inflammation (CRP: Acute Phase) (ESR: Chronic Phase) ?No results found for: CRP, ESRSEDRATE, LATICACIDVEN    ?  ? ?Note: Above Lab results reviewed. ? ?Imaging  ?[place] [facility] 1-60 MIN NO REPORT ?Fluoro was used, but no Radiologist interpretation will be provided.  ?Please refer to "NOTES" tab for provider progress note. ? ?Assessment  ?The primary encounter diagnosis was Rotator cuff arthropathy of shoulder (Right). Diagnoses of Chronic shoulder pain (Right), Rotator cuff arthropathy of both shoulders, Pain in shoulder region after shoulder replacement (Right), and Chronic pain syndrome were also pertinent to this visit. ? ?Plan of Care  ? ?Claudia Brennan has a current medication list which includes the following long-term medication(s): atorvastatin, citalopram, hydrochlorothiazide, niacin, pantoprazole, pregabalin, topiramate, trazodone, and nortriptyline. ? ?Pharmacotherapy (Medications Ordered): ?Meds ordered this encounter  ?Medications  ? nortriptyline (PAMELOR) 10 MG capsule  ?  Sig: Take 2 capsules  (20 mg total) by mouth daily.  ?  Dispense:  60 capsule  ?  Refill:  0  ? ? ? ?Follow-up plan:   ?Return if symptoms worsen or fail to improve.   ? ?Recent Visits ?Date Type Provider Dept  ?07/18/21 Procedure visit Claudia Santa, Brennan [facility]  ?06/27/21 Office Visit Claudia Santa, Brennan [facility]  ?05/30/21 Procedure visit Claudia Santa, Brennan [facility]  ?Showing recent visits within past 90 days and meeting all other requirements ?Today's Visits ?Date Type Provider Dept  ?08/15/21 Office Visit Claudia Santa, Brennan [facility]  ?Showing today's visits and meeting all other requirements ?Future Appointments ?No visits were found meeting these conditions. ?Showing future appointments within next 90 days and meeting all other requirements ? ?I discussed the assessment and treatment plan with the patient. The patient was provided an opportunity to ask questions and all were answered. The patient agreed with the plan and demonstrated an understanding of the instructions. ? ?Patient advised to call back or seek an in-person evaluation if the symptoms or condition worsens. ? ?Duration of encounter: 59mnutes. ? ?Note by: Claudia Brennan ?Date: 08/15/2021; Time: 2:35 PM ?

## 2021-08-15 NOTE — Telephone Encounter (Signed)
Returned patient phone call.  Let her know that I have spoken with pharmacist and since she just filled a pamelor 10 mg qty 30,  she can take 2 tabs for the next 15 days.  After that she will be able to fill the new Rx that was sent in today with an increase from 10 mg/d to 20 mg/d.  Patient verbalizes u/o information.  ?

## 2021-12-20 ENCOUNTER — Ambulatory Visit: Payer: Medicare Other | Admitting: Student in an Organized Health Care Education/Training Program

## 2021-12-24 ENCOUNTER — Emergency Department
Admission: EM | Admit: 2021-12-24 | Discharge: 2021-12-24 | Disposition: A | Discharge status: Home / Self Care | Payer: Medicare Other | Attending: Emergency Medicine | Admitting: Emergency Medicine

## 2021-12-24 ENCOUNTER — Other Ambulatory Visit: Payer: Self-pay

## 2021-12-24 DIAGNOSIS — N3000 Acute cystitis without hematuria: Secondary | ICD-10-CM

## 2021-12-24 DIAGNOSIS — E876 Hypokalemia: Secondary | ICD-10-CM | POA: Diagnosis not present

## 2021-12-24 DIAGNOSIS — I10 Essential (primary) hypertension: Secondary | ICD-10-CM | POA: Insufficient documentation

## 2021-12-24 DIAGNOSIS — F17200 Nicotine dependence, unspecified, uncomplicated: Secondary | ICD-10-CM | POA: Insufficient documentation

## 2021-12-24 DIAGNOSIS — R799 Abnormal finding of blood chemistry, unspecified: Secondary | ICD-10-CM | POA: Diagnosis present

## 2021-12-24 LAB — CBC
HCT: 36.3 % (ref 36.0–46.0)
Hemoglobin: 11.2 g/dL — ABNORMAL LOW (ref 12.0–15.0)
MCH: 27.3 pg (ref 26.0–34.0)
MCHC: 30.9 g/dL (ref 30.0–36.0)
MCV: 88.5 fL (ref 80.0–100.0)
Platelets: 211 10*3/uL (ref 150–400)
RBC: 4.1 MIL/uL (ref 3.87–5.11)
RDW: 13.6 % (ref 11.5–15.5)
WBC: 9 10*3/uL (ref 4.0–10.5)
nRBC: 0 % (ref 0.0–0.2)

## 2021-12-24 LAB — BASIC METABOLIC PANEL
Anion gap: 9 (ref 5–15)
BUN: 18 mg/dL (ref 8–23)
CO2: 28 mmol/L (ref 22–32)
Calcium: 10.1 mg/dL (ref 8.9–10.3)
Chloride: 106 mmol/L (ref 98–111)
Creatinine, Ser: 0.94 mg/dL (ref 0.44–1.00)
GFR, Estimated: >60 mL/min (ref >60)
Glucose, Bld: 108 mg/dL — ABNORMAL HIGH (ref 70–99)
Potassium: 3.4 mmol/L — ABNORMAL LOW (ref 3.5–5.1)
Sodium: 143 mmol/L (ref 135–145)

## 2021-12-24 LAB — URINALYSIS, ROUTINE W REFLEX MICROSCOPIC
Bacteria, UA: NONE SEEN
Bilirubin Urine: NEGATIVE
Glucose, UA: NEGATIVE mg/dL
Hgb urine dipstick: NEGATIVE
Ketones, ur: NEGATIVE mg/dL
Nitrite: NEGATIVE
Protein, ur: NEGATIVE mg/dL
Specific Gravity, Urine: 1.024 (ref 1.005–1.030)
WBC, UA: >50 WBC/hpf — ABNORMAL HIGH (ref 0–5)
pH: 5 (ref 5.0–8.0)

## 2021-12-24 LAB — MAGNESIUM: Magnesium: 2.1 mg/dL (ref 1.7–2.4)

## 2021-12-24 MED ORDER — POTASSIUM CHLORIDE 20 MEQ PO PACK
40.0000 meq | PACK | Freq: Two times a day (BID) | ORAL | Status: DC
  Administered 2021-12-24: 40 meq via ORAL
  Filled 2021-12-24: qty 2

## 2021-12-24 MED ORDER — CEFDINIR 300 MG PO CAPS: 300.0000 mg | ORAL_CAPSULE | Freq: Two times a day (BID) | ORAL | 0 refills | Status: AC

## 2021-12-24 NOTE — ED Provider Notes (Signed)
[facility] Provider Note    Event Date/Time   First MD Initiated Contact with Patient 12/24/21 2020     (approximate)   History   Abnormal Lab   HPI  Claudia Brennan is a 71 y.o. female who presents today for evaluation of abnormal lab result.  Patient reports that she was called by her PCP today for potassium level of 2.9.  Patient reports that she has had loose stools for a couple of weeks and is scheduled to have a stool culture tomorrow.  She denies any abdominal pain or cramping.  She denies any fevers or chills.  She has not had any muscle aches or pains.  No nausea or vomiting.  She reports that since sitting in the waiting room she has developed mild suprapubic discomfort.  She denies any back pain.  Patient Active Problem List   Diagnosis Date Noted   Chronic radicular lumbar pain 04/03/2021   Bilateral chronic knee pain 02/13/2021   Disorder of bursae of shoulder region 02/13/2021   Osteoarthritis of right shoulder 02/13/2021   Stiffness of shoulder joint 02/13/2021   Neck pain 02/13/2021   Localized, primary osteoarthritis of hand 02/13/2021   Muscle weakness 02/13/2021   Osteoarthritis of knee 02/13/2021   Pain in thoracic spine 02/13/2021   Calcific tendinitis of shoulder 02/13/2021   Smoker 02/13/2021   Thoracic back sprain 02/13/2021   Complex regional pain syndrome type 1 affecting shoulder (Right) 02/13/2021   Abnormal CT scan, cervical spine ([facility]) (11/26/2019) 02/13/2021   Grade 1 Anterolisthesis of cervical spine C3/C4 and C7/T1 02/13/2021   Grade 1 Anterolisthesis of thoracic spine of T2/T3 02/13/2021   Cervical facet arthropathy (Multilevel) (Bilateral) 02/13/2021   Radicular pain of shoulder (Right) 02/13/2021   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) (Left: C3-4) 02/13/2021   Complex regional pain syndrome type 2 of right upper extremity 02/05/2021   Neuropathy, axillary nerve 02/05/2021   Neuropathic pain  of shoulder (Right) 02/05/2021   S/P reverse arthroplasty of shoulder (Right) 02/05/2021   Chronic pain syndrome 02/05/2021   Anemia 01/02/2021   Alcohol intake above recommended sensible limits 01/02/2021   Chronic low back pain 01/02/2021   Depressive disorder 01/02/2021   Full thickness rotator cuff tear 01/02/2021   Pain in shoulder region after shoulder replacement (Right) 01/02/2021   Muscle pain 01/02/2021   Prediabetes 01/02/2021   Rotator cuff arthropathy of shoulder (Right) 01/02/2021   Sciatica 01/02/2021   Contusion of right foot 10/09/2020   Chronic shoulder pain (Right) 09/22/2020   Aftercare following joint replacement surgery 07/12/2020   Presence of artificial shoulder joint (Right) 07/12/2020   Gastroesophageal reflux disease 05/20/2020   Insomnia 05/20/2020   Major depressive disorder, single episode, unspecified 05/20/2020   Other cervical disc degeneration, unspecified cervical region 05/20/2020   Primary generalized (osteo)arthritis 05/20/2020   Spinal stenosis, lumbar region, with neurogenic claudication 05/20/2020   Acquired deformity of limb 05/08/2020   Metatarsalgia 05/08/2020   Plantar fasciitis of right foot 05/08/2020   Closed fracture of proximal phalanx of great toe 12/13/2019   Closed fracture of phalanx of foot 12/13/2019   Open fracture of distal phalanx of great toe 12/13/2019   Headache, unspecified headache type 07/12/2019   Personal history of nicotine dependence 05/21/2019   Hx of smoking 03/13/2018   Anxiety disorder 01/09/2018   Diverticulosis 12/02/2013   Genital herpes 06/01/2012   DDD (degenerative disc disease), cervical 02/19/2012   HTN (hypertension) 08/13/2011   Hyperlipidemia 05/23/2011  Kidney stone 11/29/2010   H/O adenomatous polyp of colon 01/15/2010          Physical Exam   Triage Vital Signs: ED Triage Vitals  Enc Vitals Group     BP 12/24/21 1640 124/79     Pulse Rate 12/24/21 1640 65     Resp 12/24/21 1640  18     Temp 12/24/21 1640 98.6 F (37 C)     Temp Source 12/24/21 1640 Oral     SpO2 12/24/21 1640 100 %     Weight 12/24/21 1639 153 lb (69.4 kg)     Height 12/24/21 1639 5\' 5"  (1.651 m)     Head Circumference --      Peak Flow --      Pain Score 12/24/21 1639 0     Pain Loc --      Pain Edu? --      Excl. in GC? --     Most recent vital signs: Vitals:   12/24/21 1640  BP: 124/79  Pulse: 65  Resp: 18  Temp: 98.6 F (37 C)  SpO2: 100%    Physical Exam Vitals and nursing note reviewed.  Constitutional:      General: Awake and alert. No acute distress.    Appearance: Normal appearance. The patient is normal weight.  HENT:     Head: Normocephalic and atraumatic.     Mouth: Mucous membranes are moist.  Eyes:     General: PERRL. Normal EOMs        Right eye: No discharge.        Left eye: No discharge.     Conjunctiva/sclera: Conjunctivae normal.  Cardiovascular:     Rate and Rhythm: Normal rate and regular rhythm.     Pulses: Normal pulses.     Heart sounds: Normal heart sounds Pulmonary:     Effort: Pulmonary effort is normal. No respiratory distress.     Breath sounds: Normal breath sounds.  Abdominal:     Abdomen is soft. There is very mild suprapubic discomfort, no abdominal tenderness elsewhere. No rebound or guarding. No distention. Musculoskeletal:        General: No swelling. Normal range of motion.     Cervical back: Normal range of motion and neck supple.  Skin:    General: Skin is warm and dry.     Capillary Refill: Capillary refill takes less than 2 seconds.     Findings: No rash.  Neurological:     Mental Status: The patient is awake and alert.      ED Results / Procedures / Treatments   Labs (all labs ordered are listed, but only abnormal results are displayed) Labs Reviewed  CBC - Abnormal; Notable for the following components:      Result Value   Hemoglobin 11.2 (*)    All other components within normal limits  BASIC METABOLIC PANEL -  Abnormal; Notable for the following components:   Potassium 3.4 (*)    Glucose, Bld 108 (*)    All other components within normal limits  URINALYSIS, ROUTINE W REFLEX MICROSCOPIC - Abnormal; Notable for the following components:   Color, Urine YELLOW (*)    APPearance HAZY (*)    Leukocytes,Ua LARGE (*)    WBC, UA >50 (*)    Non Squamous Epithelial PRESENT (*)    All other components within normal limits  MAGNESIUM     EKG     RADIOLOGY     PROCEDURES:  Critical Care performed:  Procedures   MEDICATIONS ORDERED IN ED: Medications  potassium chloride (KLOR-CON) packet 40 mEq (40 mEq Oral Given 12/24/21 2038)     IMPRESSION / MDM / ASSESSMENT AND PLAN / ED COURSE  I reviewed the triage vital signs and the nursing notes.   Differential diagnosis includes, but is not limited to, hypokalemia, gastroenteritis, C. Difficile, colitis, UTI/pyelonephritis.  Patient is awake and alert, hemodynamically stable and afebrile.  Labs obtained demonstrate a potassium level of 3.3 today.  EKG without significant arrhythmia.  Her urine does appear to have a UTI which would correspond with her suprapubic discomfort.  No CVA tenderness or back pain or fever to suggest pyelonephritis.  She will be started on antibiotics.  No abdominal tenderness elsewhere to suggest other intra-abdominal abnormality.  Patient is afebrile.  She was started on cefdinir.  She reports that she has not had a problem with cephalosporins in the past.  No history of anaphylaxis to penicillins.  She was given p.o. potassium in the emergency department.  I recommend that she have her potassium rechecked this week by her PCP.  We discussed return precautions and the importance of close outpatient follow-up.  Patient understands and agrees with plan.  She was discharged in stable condition.   Patient's presentation is most consistent with acute complicated illness / injury requiring diagnostic workup.      FINAL  CLINICAL IMPRESSION(S) / ED DIAGNOSES   Final diagnoses:  Hypokalemia  Acute cystitis without hematuria     Rx / DC Orders   ED Discharge Orders          Ordered    cefdinir (OMNICEF) 300 MG capsule  2 times daily        12/24/21 2119             Note:  This document was prepared using Dragon voice recognition software and may include unintentional dictation errors.   Emeline Gins 12/24/21 2122    Delman Kitten, MD 12/25/21 Laureen Abrahams

## 2021-12-24 NOTE — Discharge Instructions (Addendum)
Take the antibiotic as prescribed.  Your potassium was repleted.  Please have this rechecked by your primary care provider this week.  Please return for any new, worsening, or change in symptoms or other concerns.  It is a pleasure caring for you today.

## 2021-12-24 NOTE — ED Notes (Signed)
Pt DC to home. Dc instructions reviewed with all questions answered. Pt verbalizes understanding. Pt ambulatory out of dept with steady gait

## 2021-12-24 NOTE — ED Provider Triage Note (Signed)
  Emergency Medicine Provider Triage Evaluation Note  Claudia Brennan , a 71 y.o.female,  was evaluated in triage.  Pt complains of low potassium.  She was reportedly being seen by her primary care provider a few days ago for diarrhea that she has been having for the past 2 to 3 weeks.  When she got routine blood work, they found that her potassium was around 2.9.  Denies any hematochezia, abdominal pain, flank pain, or urinary symptoms at this time.   Review of Systems  Positive: Diarrhea Negative: Denies fever, chest pain, vomiting  Physical Exam  There were no vitals filed for this visit. Gen:   Awake, no distress   Resp:  Normal effort  MSK:   Moves extremities without difficulty  Other:    Medical Decision Making  Given the patient's initial medical screening exam, the following diagnostic evaluation has been ordered. The patient will be placed in the appropriate treatment space, once one is available, to complete the evaluation and treatment. I have discussed the plan of care with the patient and I have advised the patient that an ED physician or mid-level practitioner will reevaluate their condition after the test results have been received, as the results may give them additional insight into the type of treatment they may need.    Diagnostics: Labs, UA, EKG  Treatments: none immediately   Varney Daily, [place] 12/24/21 1552

## 2021-12-24 NOTE — ED Triage Notes (Signed)
Pt states on Friday she saw her PCP on Friday for diarrhea that started 7/23- pt was told that she got a call from her PCP today and her potassium was 2.2

## 2022-01-01 ENCOUNTER — Ambulatory Visit
Payer: Medicare Other | Attending: Student in an Organized Health Care Education/Training Program | Admitting: Student in an Organized Health Care Education/Training Program

## 2022-01-01 ENCOUNTER — Encounter: Payer: Self-pay | Admitting: Student in an Organized Health Care Education/Training Program

## 2022-01-01 VITALS — BP 117/72 | HR 73 | Temp 97.3°F | Resp 16 | Ht 64.0 in | Wt 153.0 lb

## 2022-01-01 DIAGNOSIS — G894 Chronic pain syndrome: Secondary | ICD-10-CM | POA: Diagnosis present

## 2022-01-01 DIAGNOSIS — G8929 Other chronic pain: Secondary | ICD-10-CM | POA: Diagnosis present

## 2022-01-01 DIAGNOSIS — M25519 Pain in unspecified shoulder: Secondary | ICD-10-CM | POA: Diagnosis present

## 2022-01-01 DIAGNOSIS — M25511 Pain in right shoulder: Secondary | ICD-10-CM | POA: Insufficient documentation

## 2022-01-01 DIAGNOSIS — Z96619 Presence of unspecified artificial shoulder joint: Secondary | ICD-10-CM | POA: Insufficient documentation

## 2022-01-01 DIAGNOSIS — M12811 Other specific arthropathies, not elsewhere classified, right shoulder: Secondary | ICD-10-CM | POA: Insufficient documentation

## 2022-01-01 NOTE — Patient Instructions (Addendum)
GENERAL RISKS AND COMPLICATIONS ° °What are the risk, side effects and possible complications? °Generally speaking, most procedures are safe.  However, with any procedure there are risks, side effects, and the possibility of complications.  The risks and complications are dependent upon the sites that are lesioned, or the type of nerve block to be performed.  The closer the procedure is to the spine, the more serious the risks are.  Great care is taken when placing the radio frequency needles, block needles or lesioning probes, but sometimes complications can occur. °Infection: Any time there is an injection through the skin, there is a risk of infection.  This is why sterile conditions are used for these blocks.  There are four possible types of infection. °Localized skin infection. °Central Nervous System Infection-This can be in the form of Meningitis, which can be deadly. °Epidural Infections-This can be in the form of an epidural abscess, which can cause pressure inside of the spine, causing compression of the spinal cord with subsequent paralysis. This would require an emergency surgery to decompress, and there are no guarantees that the patient would recover from the paralysis. °Discitis-This is an infection of the intervertebral discs.  It occurs in about 1% of discography procedures.  It is difficult to treat and it may lead to surgery. ° °      2. Pain: the needles have to go through skin and soft tissues, will cause soreness. °      3. Damage to internal structures:  The nerves to be lesioned may be near blood vessels or   ° other nerves which can be potentially damaged. °      4. Bleeding: Bleeding is more common if the patient is taking blood thinners such as  aspirin, Coumadin, Ticiid, Plavix, etc., or if he/she have some genetic predisposition  such as hemophilia. Bleeding into the spinal canal can cause compression of the spinal  cord with subsequent paralysis.  This would require an emergency  surgery to  decompress and there are no guarantees that the patient would recover from the  paralysis. °      5. Pneumothorax:  Puncturing of a lung is a possibility, every time a needle is introduced in  the area of the chest or upper back.  Pneumothorax refers to free air around the  collapsed lung(s), inside of the thoracic cavity (chest cavity).  Another two possible  complications related to a similar event would include: Hemothorax and Chylothorax.   These are variations of the Pneumothorax, where instead of air around the collapsed  lung(s), you may have blood or chyle, respectively. °      6. Spinal headaches: They may occur with any procedures in the area of the spine. °      7. Persistent CSF (Cerebro-Spinal Fluid) leakage: This is a rare problem, but may occur  with prolonged intrathecal or epidural catheters either due to the formation of a fistulous  track or a dural tear. °      8. Nerve damage: By working so close to the spinal cord, there is always a possibility of  nerve damage, which could be as serious as a permanent spinal cord injury with  paralysis. °      9. Death:  Although rare, severe deadly allergic reactions known as "Anaphylactic  reaction" can occur to any of the medications used. °     10. Worsening of the symptoms:  We can always make thing worse. ° °What are the chances   of something like this happening? Chances of any of this occuring are extremely low.  By statistics, you have more of a chance of getting killed in a motor vehicle accident: while driving to the hospital than any of the above occurring .  Nevertheless, you should be aware that they are possibilities.  In general, it is similar to taking a shower.  Everybody knows that you can slip, hit your head and get killed.  Does that mean that you should not shower again?  Nevertheless always keep in mind that statistics do not mean anything if you happen to be on the wrong side of them.  Even if a procedure has a 1 (one) in a  1,000,000 (million) chance of going wrong, it you happen to be that one..Also, keep in mind that by statistics, you have more of a chance of having something go wrong when taking medications.  Who should not have this procedure? If you are on a blood thinning medication (e.g. Coumadin, Plavix, see list of "Blood Thinners"), or if you have an active infection going on, you should not have the procedure.  If you are taking any blood thinners, please inform your physician.  How should I prepare for this procedure? Do not eat or drink anything at least six hours prior to the procedure. Bring a driver with you .  It cannot be a taxi. Come accompanied by an adult that can drive you back, and that is strong enough to help you if your legs get weak or numb from the local anesthetic. Take all of your medicines the morning of the procedure with just enough water to swallow them. If you have diabetes, make sure that you are scheduled to have your procedure done first thing in the morning, whenever possible. If you have diabetes, take only half of your insulin dose and notify our nurse that you have done so as soon as you arrive at the clinic. If you are diabetic, but only take blood sugar pills (oral hypoglycemic), then do not take them on the morning of your procedure.  You may take them after you have had the procedure. Do not take aspirin or any aspirin-containing medications, at least eleven (11) days prior to the procedure.  They may prolong bleeding. Wear loose fitting clothing that may be easy to take off and that you would not mind if it got stained with Betadine or blood. Do not wear any jewelry or perfume Remove any nail coloring.  It will interfere with some of our monitoring equipment.  NOTE: Remember that this is not meant to be interpreted as a complete list of all possible complications.  Unforeseen problems may occur.  BLOOD THINNERS The following drugs contain aspirin or other products,  which can cause increased bleeding during surgery and should not be taken for 2 weeks prior to and 1 week after surgery.  If you should need take something for relief of minor pain, you may take acetaminophen which is found in Tylenol,m Datril, Anacin-3 and Panadol. It is not blood thinner. The products listed below are.  Do not take any of the products listed below in addition to any listed on your instruction sheet.  A.P.C or A.P.C with Codeine Codeine Phosphate Capsules #3 Ibuprofen Ridaura  ABC compound Congesprin Imuran rimadil  Advil Cope Indocin Robaxisal  Alka-Seltzer Effervescent Pain Reliever and Antacid Coricidin or Coricidin-D  Indomethacin Rufen  Alka-Seltzer plus Cold Medicine Cosprin Ketoprofen S-A-C Tablets  Anacin Analgesic Tablets or Capsules Coumadin   Korlgesic Salflex  Anacin Extra Strength Analgesic tablets or capsules CP-2 Tablets Lanoril Salicylate  Anaprox Cuprimine Capsules Levenox Salocol  Anexsia-D Dalteparin Magan Salsalate  Anodynos Darvon compound Magnesium Salicylate Sine-off  Ansaid Dasin Capsules Magsal Sodium Salicylate  Anturane Depen Capsules Marnal Soma  APF Arthritis pain formula Dewitt's Pills Measurin Stanback  Argesic Dia-Gesic Meclofenamic Sulfinpyrazone  Arthritis Bayer Timed Release Aspirin Diclofenac Meclomen Sulindac  Arthritis pain formula Anacin Dicumarol Medipren Supac  Analgesic (Safety coated) Arthralgen Diffunasal Mefanamic Suprofen  Arthritis Strength Bufferin Dihydrocodeine Mepro Compound Suprol  Arthropan liquid Dopirydamole Methcarbomol with Aspirin Synalgos  ASA tablets/Enseals Disalcid Micrainin Tagament  Ascriptin Doan's Midol Talwin  Ascriptin A/D Dolene Mobidin Tanderil  Ascriptin Extra Strength Dolobid Moblgesic Ticlid  Ascriptin with Codeine Doloprin or Doloprin with Codeine Momentum Tolectin  Asperbuf Duoprin Mono-gesic Trendar  Aspergum Duradyne Motrin or Motrin IB Triminicin  Aspirin plain, buffered or enteric coated  Durasal Myochrisine Trigesic  Aspirin Suppositories Easprin Nalfon Trillsate  Aspirin with Codeine Ecotrin Regular or Extra Strength Naprosyn Uracel  Atromid-S Efficin Naproxen Ursinus  Auranofin Capsules Elmiron Neocylate Vanquish  Axotal Emagrin Norgesic Verin  Azathioprine Empirin or Empirin with Codeine Normiflo Vitamin E  Azolid Emprazil Nuprin Voltaren  Bayer Aspirin plain, buffered or children's or timed BC Tablets or powders Encaprin Orgaran Warfarin Sodium  Buff-a-Comp Enoxaparin Orudis Zorpin  Buff-a-Comp with Codeine Equegesic Os-Cal-Gesic   Buffaprin Excedrin plain, buffered or Extra Strength Oxalid   Bufferin Arthritis Strength Feldene Oxphenbutazone   Bufferin plain or Extra Strength Feldene Capsules Oxycodone with Aspirin   Bufferin with Codeine Fenoprofen Fenoprofen Pabalate or Pabalate-SF   Buffets II Flogesic Panagesic   Buffinol plain or Extra Strength Florinal or Florinal with Codeine Panwarfarin   Buf-Tabs Flurbiprofen Penicillamine   Butalbital Compound Four-way cold tablets Penicillin   Butazolidin Fragmin Pepto-Bismol   Carbenicillin Geminisyn Percodan   Carna Arthritis Reliever Geopen Persantine   Carprofen Gold's salt Persistin   Chloramphenicol Goody's Phenylbutazone   Chloromycetin Haltrain Piroxlcam   Clmetidine heparin Plaquenil   Cllnoril Hyco-pap Ponstel   Clofibrate Hydroxy chloroquine Propoxyphen         Before stopping any of these medications, be sure to consult the physician who ordered them.  Some, such as Coumadin (Warfarin) are ordered to prevent or treat serious conditions such as "deep thrombosis", "pumonary embolisms", and other heart problems.  The amount of time that you may need off of the medication may also vary with the medication and the reason for which you were taking it.  If you are taking any of these medications, please make sure you notify your pain physician before you undergo any procedures.         Trigger Point  Injections Patient Information  Description: Trigger points are areas of muscle sensitive to touch which cause pain with movement, sometimes felt some distance from the site of palpation.  Usually the muscle containing these trigger points if felt as a tight band or knot.   The area of maximum tenderness or trigger point is identified, and after antiseptic preparation of the skin, a small needle is placed into this site.  Reproduction of the pain often occurs and numbing medicine (local anesthetic) is injected into the site, sometimes along with steroid preparation.  The entire block usually lasts less than 5 minutes.  Conditions which may be treated by trigger points:  Muscular pain and spasm Nerve irritation  Preparation for the injection:  Do not eat any solid food or dairy products within 8 hours  of your appointment. You may drink clear liquids up to 3 hours before appointment.  Clear liquids include water, black coffee, juice or soda.  No milk or cream please. You may take your regular medications, including pain medications, with a sip of water before your appointment.  Diabetics should hold regular insulin ( if take separately) and take 1/2 normal NPH dose the morning of the procedure.  Carry some sugar containing items with you to your appointment. A driver must accompany you and be prepared to drive you home after your procedure.  Bring all your current medications with you. An IV may be inserted and sedation may be given at the discretion of the physician.  A blood pressure cuff, EKG, and other monitors will often be applied during the procedure.  Some patients may need to have extra oxygen administered for a short period. You will be asked to provide medical information, including your allergies and medications, prior to the procedure.  We must know immediately if you are taking blood thinners (like Coumadin/Warfarin) or if you are allergic to IV iodine contrast (dye).  We must know if  you could possibly be pregnant.  Possible side-effects:  Bleeding from needle site Infection (rare, may require surgery) Nerve injury (rare) Numbness & tingling (temporary) Punctured lung (if injection around chest) Light-headedness (temporary) Pain at injection site (several days) Decreased blood pressure (rare, temporary) Weakness in arm/leg (temporary)  Call if you experience:  Hive or difficulty breathing (go to the emergency room) Inflammation or drainage at the injection site(s)  Please note:  Although the local anesthetic injected can often make your painful muscle feel good for several hours after the injection, the pain may return.  It takes 3-7 days for steroids to work.  You may not notice any pain relief for at least one week.  If effective, we will often do a series of injections spaced 3-6 weeks apart to maximally decrease your pain.  If you have any questions please call [phone] [facility] Clinic[facility] Patient Information  Description: Specific nerve roots exit the spinal canal and these nerves can be compressed and inflamed by a bulging disc and bone spurs.  By injecting steroids on the nerve root, we can potentially decrease the inflammation surrounding these nerves, which often leads to decreased pain.  Also, by injecting local anesthesia on the nerve root, this can provide [place] helpful information to give to your referring doctor if it decreases your pain.  Selective nerve root blocks can be done along the spine from the neck to the low back depending on the location of your pain.   After numbing the skin with local anesthesia, a small needle is passed to the nerve root and the position of the needle is verified using x-ray pictures.  After the needle is in correct position, we then deposit the medication.  You may experience a pressure sensation while this is being done.  The entire block usually lasts less than 15  minutes.  Conditions that may be treated with selective nerve root blocks: Low back and leg pain Spinal stenosis Diagnostic block prior to potential surgery Neck and arm pain Post laminectomy syndrome  Preparation for the injection:  Do not eat any solid food or dairy products within 8 hours of your appointment. You may drink clear liquids up to 3 hours before an appointment.  Clear liquids include water, black coffee, juice or soda.  No milk or cream please. You may  take your regular medications, including pain medications, with a sip of water before your appointment.  Diabetics should hold regular insulin (if taken separately) and take 1/2 normal NPH dose the morning of the procedure.  Carry some sugar containing items with you to your appointment. A driver must accompany you and be prepared to drive you home after your procedure. Bring all your current medications with you. An IV may be inserted and sedation may be given at the discretion of the physician. A blood pressure cuff, EKG, and other monitors will often be applied during the procedure.  Some patients may need to have extra oxygen administered for a short period. You will be asked to provide medical information, including allergies, prior to the procedure.  We must know immediately if you are taking blood  Thinners (like Coumadin) or if you are allergic to IV iodine contrast (dye).  Possible side-effects: All are usually temporary Bleeding from needle site Light headedness Numbness and tingling Decreased blood pressure Weakness in arms/legs Pressure sensation in back/neck Pain at injection site (several days)  Possible complications: All are extremely rare Infection Nerve injury Spinal headache (a headache wore with upright position)  Call if you experience: Fever/chills associated with headache or increased back/neck pain Headache worsened by an upright position New onset weakness or numbness of an extremity  below the injection site Hives or difficulty breathing (go to the emergency room) Inflammation or drainage at the injection site(s) Severe back/neck pain greater than usual New symptoms which are concerning to you  Please note:  Although the local anesthetic injected can often make your back or neck feel good for several hours after the injection the pain will likely return.  It takes 3-5 days for steroids to work on the nerve root. You may not notice any pain relief for at least one week.  If effective, we will often do a series of 3 injections spaced 3-6 weeks apart to maximally decrease your pain.    If you have any questions, please call [phone] [facility]

## 2022-01-01 NOTE — Progress Notes (Signed)
PROVIDER NOTE: Information contained herein reflects review and annotations entered in association with encounter. Interpretation of such information and data should be left to medically-trained personnel. Information provided to patient can be located elsewhere in the medical record under "Patient Instructions". Document created using STT-dictation technology, any transcriptional errors that may result from process are unintentional.    Patient: Claudia Brennan  Service Category: E/M  Provider: Gillis Santa, MD  DOB: 1951-02-17  DOS: 01/01/2022  Referring Provider: Irish Elders, MD  MRN: [id number]  Specialty: Interventional Pain Management  PCP: Irish Elders, MD  Type: Established Patient  Setting: Ambulatory outpatient    Location: Office  Delivery: Face-to-face     HPI  [name], a 71 y.o. year old female, is here today because of her Rotator cuff arthropathy of right shoulder [M12.811]. [name] primary complain today is Shoulder Pain (right) Last encounter: My last encounter with her was on 08/15/2021.  Pain Assessment: Severity of Chronic pain is reported as a 5 /10. Location: Shoulder Right/ . Onset: More than a month ago. Quality: Aching, Sharp. Timing: Constant. Modifying factor(s): nothing helps. Vitals:  height is _0  (1.626 m) and weight is 153 lb (69.4 kg). Her temporal temperature is 97.3 F (36.3 C) (abnormal). Her blood pressure is 117/72 and her pulse is 73. Her respiration is 16 and oxygen saturation is 100%.   Reason for encounter: evaluation of worsening, or previously known (established) problem.  Increased right shoulder pain.  Previous right suprascapular nerve block was done July 18, 2021.  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 She is also having pain along her deltoid in her bicep for which we discussed a trigger point injection.    ROS  Constitutional: Denies any fever or chills Gastrointestinal: No  reported hemesis, hematochezia, vomiting, or acute GI distress Musculoskeletal:  Right shoulder pain Neurological: No reported episodes of acute onset apraxia, aphasia, dysarthria, agnosia, amnesia, paralysis, loss of coordination, or loss of consciousness  Medication Review  atorvastatin, citalopram, hydrochlorothiazide, niacin, pantoprazole, pregabalin, topiramate, and traZODone  History Review  Allergy: [name] is allergic to penicillins. Drug: [name]  reports no history of drug use. Alcohol:  has no history on file for alcohol use. Tobacco:  reports that she has quit smoking. Her smoking use included cigarettes. She has never used smokeless tobacco. Social: [name]  reports that she has quit smoking. Her smoking use included cigarettes. She has never used smokeless tobacco. She reports that she does not use drugs. Medical:  has a past medical history of Arthritis, Cataract, GERD (gastroesophageal reflux disease), and Hyperlipidemia. Surgical: [name]  has a past surgical history that includes Knee Arthroplasty and Shoulder arthroscopy with rotator cuff repair and open biceps tenodesis (Bilateral). Family: family history is not on file.  Laboratory Chemistry Profile   Renal Lab Results  Component Value Date   BUN 18 12/24/2021   CREATININE 0.94 12/24/2021   GFRAA >60 12/22/2019   GFRNONAA >60 12/24/2021    Hepatic Lab Results  Component Value Date   AST 15 12/22/2019   ALT 17 12/22/2019   ALBUMIN 4.2 12/22/2019   ALKPHOS 79 12/22/2019    Electrolytes Lab Results  Component Value Date   NA 143 12/24/2021   K 3.4 (L) 12/24/2021   CL 106 12/24/2021   CALCIUM 10.1 12/24/2021   MG 2.1 12/24/2021    Bone No results found for: "VD25OH", "VD125OH2TOT", "FH5456YB6", "LS9373SK8", "25OHVITD1", "25OHVITD2", "25OHVITD3", "TESTOFREE", "  TESTOSTERONE"  Inflammation (CRP: Acute Phase) (ESR: Chronic Phase) No results found for: "CRP", "ESRSEDRATE", "LATICACIDVEN"        Note: Above Lab results reviewed.  Physical Exam  General appearance: Well nourished, well developed, and well hydrated. In no apparent acute distress Mental status: Alert, oriented x 3 (person, place, & time)       Respiratory: No evidence of acute respiratory distress Eyes: PERLA Vitals: BP 117/72   Pulse 73   Temp (!) 97.3 F (36.3 C) (Temporal)   Resp 16   Ht _0  (1.626 m)   Wt 153 lb (69.4 kg)   SpO2 100%   BMI 26.26 kg/m  BMI: Estimated body mass index is 26.26 kg/m as calculated from the following:   Height as of this encounter: _1  (1.626 m).   Weight as of this encounter: 153 lb (69.4 kg). Ideal: Ideal body weight: 54.7 kg (120 lb 9.5 oz) Adjusted ideal body weight: 60.6 kg (133 lb 8.9 oz)  Right Shoulder Exam   Tenderness  The patient is experiencing tenderness in the acromioclavicular joint, biceps tendon and acromion.  Range of Motion  Active abduction:  abnormal  Passive abduction:  abnormal  Extension:  abnormal  External rotation:  abnormal  Forward flexion:  abnormal  Internal rotation 0 degrees:  abnormal  Internal rotation 90 degrees:  abnormal   Muscle Strength  Abduction: 4/5  Internal rotation: 4/5  External rotation: 5/5  Supraspinatus: 4/5  Subscapularis: 4/5  Biceps: 4/5   Other  Sensation: normal Pulse: present   Left Shoulder Exam  Left shoulder exam is normal.  Tests  Sulcus: absent        Assessment   Diagnosis Status  1. Rotator cuff arthropathy of shoulder (Right)   2. Chronic shoulder pain (Right)   3. Pain in shoulder region after shoulder replacement (Right)   4. Chronic pain syndrome    Having a Flare-up Having a Flare-up Having a Flare-up     Plan of Care   Orders:  Orders Placed This Encounter  Procedures   SUPRASCAPULAR NERVE BLOCK    For shoulder pain.    Standing Status:   Future    Standing Expiration Date:   04/03/2022    Scheduling Instructions:     Right SSNB    Order Specific  Question:   Where will this procedure be performed?    Answer:   [facility]   TRIGGER POINT INJECTION    Standing Status:   Future    Standing Expiration Date:   04/03/2022    Scheduling Instructions:     Right shoulder TPI    Order Specific Question:   Where will this procedure be performed?    Answer:   [facility]   Follow-up plan:   Return in about 8 days (around 01/09/2022) for Right SSNB + Right shoulder TPI.    Recent Visits No visits were found meeting these conditions. Showing recent visits within past 90 days and meeting all other requirements Today's Visits Date Type Provider Dept  01/01/22 Office Visit Gillis Santa, MD [facility]  Showing today's visits and meeting all other requirements Future Appointments Date Type Provider Dept  01/09/22 Appointment Gillis Santa, MD [facility]  Showing future appointments within next 90 days and meeting all other requirements  I discussed the assessment and treatment plan with the patient. The patient was provided an opportunity to ask questions and all were answered. The patient agreed with the  plan and demonstrated an understanding of the instructions.  Patient advised to call back or seek an in-person evaluation if the symptoms or condition worsens.  Duration of encounter: 87mnutes.  Total time on encounter, as per AMA guidelines included both the face-to-face and non-face-to-face time personally spent by the physician and/or other qualified health care professional(s) on the day of the encounter (includes time in activities that require the physician or other qualified health care professional and does not include time in activities normally performed by clinical staff). Physician's time may include the following activities when performed: preparing to see the patient (eg, review of tests, pre-charting review of records) obtaining and/or reviewing separately obtained history performing a  medically appropriate examination and/or evaluation counseling and educating the patient/family/caregiver ordering medications, tests, or procedures referring and communicating with other health care professionals (when not separately reported) documenting clinical information in the electronic or other health record independently interpreting results (not separately reported) and communicating results to the patient/ family/caregiver care coordination (not separately reported)  Note by: BGillis Santa MD Date: 01/01/2022; Time: 2:18 PM

## 2022-01-01 NOTE — Progress Notes (Signed)
Safety precautions to be maintained throughout the outpatient stay will include: orient to surroundings, keep bed in low position, maintain call bell within reach at all times, provide assistance with transfer out of bed and ambulation.  

## 2022-01-09 ENCOUNTER — Other Ambulatory Visit: Payer: Self-pay

## 2022-01-09 ENCOUNTER — Ambulatory Visit
Admission: RE | Admit: 2022-01-09 | Discharge: 2022-01-09 | Disposition: A | Discharge status: Home / Self Care | Payer: Medicare Other | Source: Ambulatory Visit | Attending: Student in an Organized Health Care Education/Training Program | Admitting: Student in an Organized Health Care Education/Training Program

## 2022-01-09 ENCOUNTER — Encounter: Payer: Self-pay | Admitting: Student in an Organized Health Care Education/Training Program

## 2022-01-09 ENCOUNTER — Ambulatory Visit
Payer: Medicare Other | Attending: Student in an Organized Health Care Education/Training Program | Admitting: Student in an Organized Health Care Education/Training Program

## 2022-01-09 VITALS — BP 131/74 | HR 68 | Temp 97.2°F | Resp 18 | Ht 65.0 in | Wt 153.0 lb

## 2022-01-09 DIAGNOSIS — Z96619 Presence of unspecified artificial shoulder joint: Secondary | ICD-10-CM | POA: Diagnosis present

## 2022-01-09 DIAGNOSIS — M12811 Other specific arthropathies, not elsewhere classified, right shoulder: Secondary | ICD-10-CM | POA: Diagnosis present

## 2022-01-09 DIAGNOSIS — M25519 Pain in unspecified shoulder: Secondary | ICD-10-CM | POA: Insufficient documentation

## 2022-01-09 DIAGNOSIS — M25511 Pain in right shoulder: Secondary | ICD-10-CM | POA: Diagnosis present

## 2022-01-09 DIAGNOSIS — G8929 Other chronic pain: Secondary | ICD-10-CM | POA: Insufficient documentation

## 2022-01-09 MED ORDER — IOHEXOL 180 MG/ML  SOLN
10.0000 mL | Freq: Once | INTRAMUSCULAR | Status: AC
  Administered 2022-01-09: 10 mL via INTRA_ARTICULAR
  Filled 2022-01-09: qty 20

## 2022-01-09 MED ORDER — LIDOCAINE HCL 2 % IJ SOLN
20.0000 mL | Freq: Once | INTRAMUSCULAR | Status: AC
  Administered 2022-01-09: 400 mg

## 2022-01-09 MED ORDER — MIDAZOLAM HCL 5 MG/5ML IJ SOLN
0.5000 mg | Freq: Once | INTRAMUSCULAR | Status: AC
  Administered 2022-01-09: 2 mg via INTRAVENOUS

## 2022-01-09 MED ORDER — MIDAZOLAM HCL 5 MG/5ML IJ SOLN
INTRAMUSCULAR | Status: AC
  Filled 2022-01-09: qty 5

## 2022-01-09 MED ORDER — DEXAMETHASONE SODIUM PHOSPHATE 10 MG/ML IJ SOLN
10.0000 mg | Freq: Once | INTRAMUSCULAR | Status: AC
  Administered 2022-01-09: 10 mg

## 2022-01-09 MED ORDER — ROPIVACAINE HCL 2 MG/ML IJ SOLN
INTRAMUSCULAR | Status: AC
  Filled 2022-01-09: qty 20

## 2022-01-09 MED ORDER — LACTATED RINGERS IV SOLN: Freq: Once | INTRAVENOUS | Status: DC

## 2022-01-09 MED ORDER — LIDOCAINE HCL 2 % IJ SOLN
INTRAMUSCULAR | Status: AC
  Filled 2022-01-09: qty 20

## 2022-01-09 MED ORDER — ROPIVACAINE HCL 2 MG/ML IJ SOLN
9.0000 mL | Freq: Once | INTRAMUSCULAR | Status: AC
  Administered 2022-01-09: 9 mL via PERINEURAL

## 2022-01-09 MED ORDER — DEXAMETHASONE SODIUM PHOSPHATE 10 MG/ML IJ SOLN
INTRAMUSCULAR | Status: AC
  Filled 2022-01-09: qty 1

## 2022-01-09 NOTE — Progress Notes (Signed)
PROVIDER NOTE: Interpretation of information contained herein should be left to medically-trained personnel. Specific patient instructions are provided elsewhere under "Patient Instructions" section of medical record. This document was created in part using STT-dictation technology, any transcriptional errors that may result from this process are unintentional.  Patient: Claudia Brennan Type: Established DOB: 04/12/1951 MRN: [id number] PCP: Irish Elders, MD  Service: Procedure DOS: 01/09/2022 Setting: Ambulatory Location: Ambulatory outpatient facility Delivery: Face-to-face Provider: Gillis Santa, MD Specialty: Interventional Pain Management Specialty designation: 09 Location: Outpatient facility Ref. Prov.: [place], Dola Argyle, MD    Primary Reason for Visit: Interventional Pain Management Treatment. CC: Shoulder Pain (Right )  Procedure:           Type: RIGHT Suprascapular nerve block #4  and RIGHT Deltoid TPI Laterality:  Right Level: Superior to scapular spine, lateral to supraspinatus fossa (Suprascapular notch).  Purpose: Therapeutic Date: 01/09/2022 Performed by: Gillis Santa, MD Anesthesia: Local (1-2% Lidocaine)  Anxiolysis: IV Versed 2 mg   Sedation: Minimal  Guidance: Fluoroscopy          Indications: Shoulder pain, severe enough to impact quality of life and/or function.  1. Rotator cuff arthropathy of shoulder (Right)   2. Chronic shoulder pain (Right)   3. Pain in shoulder region after shoulder replacement (Right)    NAS-11 score:   Pre-procedure: 8/10   Post-procedure: 2/10     Target: Suprascapular nerve Location: midway between the medial border of the scapula and the acromion as it runs through the suprascapular notch. Region: Suprascapular, posterior shoulder  Approach: Percutaneous  Neuroanatomy: The suprascapular nerve is the lateral branch of the superior trunk of the brachial plexus. It receives nerve fibers that originate in the nerve roots C5  and C6 (and sometimes C4). It is a mixed nerve, meaning that it provides both sensory and motor supply for the suprascapular region. Function: The main function of this nerve is to provide motor innervation for two muscles, the supraspinatus and infraspinatus muscles. They are part of the rotator cuff muscles. In addition, the suprascapular nerve provides a sensory supply to the joints of the scapula (glenohumeral and acromioclavicular joints). Rationale (medical necessity): procedure needed and proper for the diagnosis and/or treatment of the patient's medical symptoms and needs.  Position / Prep / Materials:  Position: Prone Materials:  Tray: Block Needle(s):  Type: Spinal  Gauge (G): 25  Length: 3.5 in.  Qty: 1 Prep solution: DuraPrep (Iodine Povacrylex [0.7% available iodine] and Isopropyl Alcohol, 74% w/w) Prep Area: Entire posterior shoulder area. From upper spine to shoulder proper (upper arm), and from lateral neck to lower tip of shoulder blade.   Pre-op H&P Assessment:  Claudia Brennan is a 71 y.o. (year old), female patient, seen today for interventional treatment. She  has a past surgical history that includes Knee Arthroplasty and Shoulder arthroscopy with rotator cuff repair and open biceps tenodesis (Bilateral). Claudia Brennan has a current medication list which includes the following prescription(s): citalopram, hydrochlorothiazide, mometasone, niacin, pantoprazole, pregabalin, topiramate, trazodone, valacyclovir, and atorvastatin, and the following Facility-Administered Medications: lactated ringers. Her primarily concern today is the Shoulder Pain (Right )  Initial Vital Signs:  Pulse/HCG Rate: 67  Temp: (!) 97.2 F (36.2 C) Resp: 16 BP: 119/80 SpO2: 98 %  BMI: Estimated body mass index is 25.46 kg/m as calculated from the following:   Height as of this encounter: 5\' 5"  (1.651 m).   Weight as of this encounter: 153 lb (69.4 kg).  Risk Assessment: Allergies: Reviewed. She is  allergic to penicillins.  Allergy Precautions: None required Coagulopathies: Reviewed. None identified.  Blood-thinner therapy: None at this time Active Infection(s): Reviewed. None identified. Claudia Brennan is afebrile  Site Confirmation: Claudia Brennan was asked to confirm the procedure and laterality before marking the site Procedure checklist: Completed Consent: Before the procedure and under the influence of no sedative(s), amnesic(s), or anxiolytics, the patient was informed of the treatment options, risks and possible complications. To fulfill our ethical and legal obligations, as recommended by the American Medical Association's Code of Ethics, I have informed the patient of my clinical impression; the nature and purpose of the treatment or procedure; the risks, benefits, and possible complications of the intervention; the alternatives, including doing nothing; the risk(s) and benefit(s) of the alternative treatment(s) or procedure(s); and the risk(s) and benefit(s) of doing nothing. The patient was provided information about the general risks and possible complications associated with the procedure. These may include, but are not limited to: failure to achieve desired goals, infection, bleeding, organ or nerve damage, allergic reactions, paralysis, and death. In addition, the patient was informed of those risks and complications associated to the procedure, such as failure to decrease pain; infection; bleeding; organ or nerve damage with subsequent damage to sensory, motor, and/or autonomic systems, resulting in permanent pain, numbness, and/or weakness of one or several areas of the body; allergic reactions; (i.e.: anaphylactic reaction); and/or death. Furthermore, the patient was informed of those risks and complications associated with the medications. These include, but are not limited to: allergic reactions (i.e.: anaphylactic or anaphylactoid reaction(s)); adrenal axis suppression; blood sugar  elevation that in diabetics may result in ketoacidosis or comma; water retention that in patients with history of congestive heart failure may result in shortness of breath, pulmonary edema, and decompensation with resultant heart failure; weight gain; swelling or edema; medication-induced neural toxicity; particulate matter embolism and blood vessel occlusion with resultant organ, and/or nervous system infarction; and/or aseptic necrosis of one or more joints. Finally, the patient was informed that Medicine is not an exact science; therefore, there is also the possibility of unforeseen or unpredictable risks and/or possible complications that may result in a catastrophic outcome. The patient indicated having understood very clearly. We have given the patient no guarantees and we have made no promises. Enough time was given to the patient to ask questions, all of which were answered to the patient's satisfaction. Ms. Stege has indicated that she wanted to continue with the procedure. Attestation: I, the ordering provider, attest that I have discussed with the patient the benefits, risks, side-effects, alternatives, likelihood of achieving goals, and potential problems during recovery for the procedure that I have provided informed consent. Date  Time: 01/09/2022  8:33 AM  Pre-Procedure Preparation:  Monitoring: As per clinic protocol. Respiration, ETCO2, SpO2, BP, heart rate and rhythm monitor placed and checked for adequate function Safety Precautions: Patient was assessed for positional comfort and pressure points before starting the procedure. Time-out: I initiated and conducted the "Time-out" before starting the procedure, as per protocol. The patient was asked to participate by confirming the accuracy of the "Time Out" information. Verification of the correct person, site, and procedure were performed and confirmed by me, the nursing staff, and the patient. "Time-out" conducted as per Joint  Commission's Universal Protocol (UP.01.01.01). Time: 0915  Description of Procedure:          Procedural Technique Safety Precautions: Aspiration looking for blood return was conducted prior to all injections. At no point did we inject any  substances, as a needle was being advanced. No attempts were made at seeking any paresthesias. Safe injection practices and needle disposal techniques used. Medications properly checked for expiration dates. SDV (single dose vial) medications used. Description of the Procedure: Protocol guidelines were followed. The patient was placed in position over the procedure table. The target area was identified and the area prepped in the usual manner. Skin & deeper tissues infiltrated with local anesthetic. Appropriate amount of time allowed to pass for local anesthetics to take effect. The procedure needles were then advanced to the target area. Proper needle placement secured. Negative aspiration confirmed. Solution injected in intermittent fashion, asking for systemic symptoms every 0.5cc of injectate. The needles were then removed and the area cleansed, making sure to leave some of the prepping solution back to take advantage of its long term bactericidal properties.  Vitals:   01/09/22 0836 01/09/22 0915 01/09/22 0919 01/09/22 0926  BP: 119/80 138/71 126/76 131/74  Pulse: 67 72 70 68  Resp: 16 (!) 21 19 18   Temp: (!) 97.2 F (36.2 C)     TempSrc: Temporal     SpO2: 98% 96% 99% 100%  Weight: 153 lb (69.4 kg)     Height: 5\' 5"  (1.651 m)       Start Time: 0915 hrs. End Time: 0918 hrs. Materials:  Needle(s) Type: Spinal Needle Gauge: 22G Length: 3.5-in Medication(s): Please see orders for medications and dosing details.  5cc solution made of 4cc of 0.2% ropivacaine, 1 cc of Decadron 10 mg/cc. Injected along the right SSN after contrast confirmation highlighting suprascapular notch.   Afterwards a right deltoid TPI was performed with 2 cc of 0.2%  Ropivacaine  Imaging Guidance (Non-Spinal):          Type of Imaging Technique: Fluoroscopy Guidance (Non-Spinal) Indication(s): Assistance in needle guidance and placement for procedures requiring needle placement in or near specific anatomical locations not easily accessible without such assistance. Exposure Time: Please see nurses notes. Contrast: Before injecting any contrast, we confirmed that the patient did not have an allergy to iodine, shellfish, or radiological contrast. Once satisfactory needle placement was completed at the desired level, radiological contrast was injected. Contrast injected under live fluoroscopy. No contrast complications. See chart for type and volume of contrast used. Fluoroscopic Guidance: I was personally present during the use of fluoroscopy. "Tunnel Vision Technique" used to obtain the best possible view of the target area. Parallax error corrected before commencing the procedure. "Direction-depth-direction" technique used to introduce the needle under continuous pulsed fluoroscopy. Once target was reached, antero-posterior, oblique, and lateral fluoroscopic projection used confirm needle placement in all planes. Images permanently stored in EMR. Interpretation: I personally interpreted the imaging intraoperatively. Adequate needle placement confirmed in multiple planes. Appropriate spread of contrast into desired area was observed. No evidence of afferent or efferent intravascular uptake. Permanent images saved into the patient's record.   Post-operative Assessment:  Post-procedure Vital Signs:  Pulse/HCG Rate: 68  Temp:  (!) 97.2 F (36.2 C) Resp: 18 BP: 131/74 SpO2: 100 %  EBL: None  Complications: No immediate post-treatment complications observed by team, or reported by patient.  Note: The patient tolerated the entire procedure well. A repeat set of vitals were taken after the procedure and the patient was kept under observation following  institutional policy, for this type of procedure. Post-procedural neurological assessment was performed, showing return to baseline, prior to discharge. The patient was provided with post-procedure discharge instructions, including a section on how to identify potential problems.  Should any problems arise concerning this procedure, the patient was given instructions to immediately contact us, at any time, without hesitation. In any case, we plan to contact the patient by telephone for a follow-up status report regarding this interventional procedure.  Comments:  No additional relevant information.  Plan of Care  Orders:  Orders Placed This Encounter  Procedures   DG PAIN CLINIC C-ARM 1-60 MIN NO REPORT    Intraoperative interpretation by procedural physician at [facility].    Standing Status:   Standing    Number of Occurrences:   1    Order Specific Question:   Reason for exam:    Answer:   Assistance in needle guidance and placement for procedures requiring needle placement in or near specific anatomical locations not easily accessible without such assistance.    Medications ordered for procedure: Meds ordered this encounter  Medications   iohexol (OMNIPAQUE) 180 MG/ML injection 10 mL    Must be Myelogram-compatible. If not available, you may substitute with a water-soluble, non-ionic, hypoallergenic, myelogram-compatible radiological contrast medium.   lidocaine (XYLOCAINE) 2 % (with pres) injection 400 mg   lactated ringers infusion   midazolam (VERSED) 5 MG/5ML injection 0.5-2 mg    Make sure Flumazenil is available in the pyxis when using this medication. If oversedation occurs, administer 0.2 mg IV over 15 sec. If after 45 sec no response, administer 0.2 mg again over 1 min; may repeat at 1 min intervals; not to exceed 4 doses (1 mg)   dexamethasone (DECADRON) injection 10 mg   ropivacaine (PF) 2 mg/mL (0.2%) (NAROPIN) injection 9 mL   Medications administered: We  administered iohexol, lidocaine, midazolam, dexamethasone, and ropivacaine (PF) 2 mg/mL (0.2%).  See the medical record for exact dosing, route, and time of administration.  Follow-up plan:   Return in about 4 weeks (around 02/06/2022) for Post Procedure Evaluation, virtual.      Recent Visits Date Type Provider Dept  01/01/22 Office Visit Edward Jolly, MD [facility]  Showing recent visits within past 90 days and meeting all other requirements Today's Visits Date Type Provider Dept  01/09/22 Procedure visit Edward Jolly, MD [facility]  Showing today's visits and meeting all other requirements Future Appointments Date Type Provider Dept  02/06/22 Appointment Edward Jolly, MD [facility]  Showing future appointments within next 90 days and meeting all other requirements  Disposition: Discharge home  Discharge (Date  Time): 01/09/2022; 0929 hrs.   Primary Care Physician: Annia Belt Dayton Bailiff, MD Location: [facility] Note by: Edward Jolly, MD Date: 01/09/2022; Time: 10:20 AM  Disclaimer:  Medicine is not an exact science. The only guarantee in medicine is that nothing is guaranteed. It is important to note that the decision to proceed with this intervention was based on the information collected from the patient. The Data and conclusions were drawn from the patient's questionnaire, the interview, and the physical examination. Because the information was provided in large part by the patient, it cannot be guaranteed that it has not been purposely or unconsciously manipulated. Every effort has been made to obtain as much relevant data as possible for this evaluation. It is important to note that the conclusions that lead to this procedure are derived in large part from the available data. Always take into account that the treatment will also be dependent on availability of resources and existing treatment guidelines, considered by  other Pain Management Practitioners as being common knowledge and practice, at  the time of the intervention. For Medico-Legal purposes, it is also important to point out that variation in procedural techniques and pharmacological choices are the acceptable norm. The indications, contraindications, technique, and results of the above procedure should only be interpreted and judged by a Board-Certified Interventional Pain Specialist with extensive familiarity and expertise in the same exact procedure and technique.

## 2022-01-09 NOTE — Progress Notes (Signed)
Safety precautions to be maintained throughout the outpatient stay will include: orient to surroundings, keep bed in low position, maintain call bell within reach at all times, provide assistance with transfer out of bed and ambulation.  

## 2022-01-09 NOTE — Patient Instructions (Signed)

## 2022-01-10 ENCOUNTER — Telehealth: Payer: Self-pay | Admitting: *Deleted

## 2022-01-10 NOTE — Telephone Encounter (Signed)
No problems post procedure. 

## 2022-02-06 ENCOUNTER — Ambulatory Visit
Payer: Medicare Other | Attending: Student in an Organized Health Care Education/Training Program | Admitting: Student in an Organized Health Care Education/Training Program

## 2022-02-06 DIAGNOSIS — M25511 Pain in right shoulder: Secondary | ICD-10-CM

## 2022-02-06 DIAGNOSIS — Z96619 Presence of unspecified artificial shoulder joint: Secondary | ICD-10-CM

## 2022-02-06 DIAGNOSIS — M12811 Other specific arthropathies, not elsewhere classified, right shoulder: Secondary | ICD-10-CM

## 2022-02-06 DIAGNOSIS — M25519 Pain in unspecified shoulder: Secondary | ICD-10-CM

## 2022-02-06 DIAGNOSIS — G8929 Other chronic pain: Secondary | ICD-10-CM

## 2022-02-06 DIAGNOSIS — G894 Chronic pain syndrome: Secondary | ICD-10-CM

## 2022-02-06 NOTE — Progress Notes (Signed)
I attempted to call the patient however no response. Voicemail left instructing patient to call front desk office at [phone] to reschedule appointment. -Dr Holley Raring   Post-procedure evaluation   Type: RIGHT Suprascapular nerve block #4  and RIGHT Deltoid TPI Laterality:  Right Level: Superior to scapular spine, lateral to supraspinatus fossa (Suprascapular notch).  Purpose: Therapeutic Date: 01/09/2022 Performed by: Gillis Santa, MD Anesthesia: Local (1-2% Lidocaine)  Anxiolysis: IV Versed 2 mg   Sedation: Minimal  Guidance: Fluoroscopy          Indications: Shoulder pain, severe enough to impact quality of life and/or function.  1. Rotator cuff arthropathy of shoulder (Right)   2. Chronic shoulder pain (Right)   3. Pain in shoulder region after shoulder replacement (Right)    NAS-11 score:   Pre-procedure: 8/10   Post-procedure: 2/10      Effectiveness:  Initial hour after procedure: 0 %  Subsequent 4-6 hours post-procedure: 0 %  Analgesia past initial 6 hours: 50 % (ongoing)

## 2022-02-07 ENCOUNTER — Telehealth: Payer: Self-pay | Admitting: Student in an Organized Health Care Education/Training Program

## 2022-02-07 NOTE — Telephone Encounter (Signed)
PT stated that she is sorry that she miss her appt on 02-06-22 due to her working out of town. PT wanted to let doctor that she is still having pain in her right shoulder. PT stated that she will call and rescheduled appt once she is back.

## 2022-02-25 ENCOUNTER — Encounter: Payer: Self-pay | Admitting: Student in an Organized Health Care Education/Training Program

## 2022-02-25 ENCOUNTER — Ambulatory Visit
Payer: Medicare Other | Attending: Student in an Organized Health Care Education/Training Program | Admitting: Student in an Organized Health Care Education/Training Program

## 2022-02-25 DIAGNOSIS — M25519 Pain in unspecified shoulder: Secondary | ICD-10-CM | POA: Diagnosis not present

## 2022-02-25 DIAGNOSIS — G894 Chronic pain syndrome: Secondary | ICD-10-CM

## 2022-02-25 DIAGNOSIS — G8929 Other chronic pain: Secondary | ICD-10-CM

## 2022-02-25 DIAGNOSIS — M25511 Pain in right shoulder: Secondary | ICD-10-CM

## 2022-02-25 DIAGNOSIS — M12811 Other specific arthropathies, not elsewhere classified, right shoulder: Secondary | ICD-10-CM

## 2022-02-25 DIAGNOSIS — Z96619 Presence of unspecified artificial shoulder joint: Secondary | ICD-10-CM

## 2022-02-25 DIAGNOSIS — M12812 Other specific arthropathies, not elsewhere classified, left shoulder: Secondary | ICD-10-CM

## 2022-02-25 MED ORDER — DICLOFENAC SODIUM 50 MG PO TBEC: 50.0000 mg | DELAYED_RELEASE_TABLET | Freq: Two times a day (BID) | ORAL | 0 refills | Status: AC

## 2022-02-25 NOTE — Progress Notes (Signed)
A patient: Claudia Brennan  Service Category: E/M  Provider: Gillis Santa, MD  DOB: 11-06-1950  DOS: 02/25/2022  Location: Office  MRN: [id number]  Setting: Ambulatory outpatient  Referring Provider: Irish Elders, MD  Type: Established Patient  Specialty: Interventional Pain Management  PCP: Irish Elders, MD  Location: Remote location  Delivery: TeleHealth     Virtual Encounter - Pain Management PROVIDER NOTE: Information contained herein reflects review and annotations entered in association with encounter. Interpretation of such information and data should be left to medically-trained personnel. Information provided to patient can be located elsewhere in the medical record under "Patient Instructions". Document created using STT-dictation technology, any transcriptional errors that may result from process are unintentional.    Contact & Pharmacy Preferred: [phone] Home: [phone] (home) Mobile: [phone] (mobile) E-mail: [email or web address]  [id number] [place] [address]Phone: [phone]: [phone]  Pre-screening  Claudia Brennan offered "in-person" vs "virtual" encounter. She indicated preferring virtual for this encounter.   Reason COVID-19*  Social distancing based on CDC and AMA recommendations.   I contacted Claudia Wilbornon 02/25/2022 via telephone.      I clearly identified myself as BGillis Santa MD. I verified that I was speaking with the correct person using two identifiers (Name: Claudia Brennan and date of birth: 9December 11, 1952.  Consent I sought verbal advanced consent from Claudia Filbertfor virtual visit interactions. I informed Claudia Brennan of possible security and privacy concerns, risks, and limitations associated with providing "not-in-person" medical evaluation and management services. I also informed Claudia Brennan of the availability of "in-person" appointments. Finally, I informed her that  there would be a charge for the virtual visit and that she could be  personally, fully or partially, financially responsible for it. Ms. PFurthexpressed understanding and agreed to proceed.   Historic Elements   Ms. DShakeria Robinetteis a 71y.o. year old, female patient evaluated today after our last contact on 02/07/2022. Ms. PPrezioso has a past medical history of Arthritis, Cataract, GERD (gastroesophageal reflux disease), and Hyperlipidemia. She also  has a past surgical history that includes Knee Arthroplasty and Shoulder arthroscopy with rotator cuff repair and open biceps tenodesis (Bilateral). Ms. PFranckowiakhas a current medication list which includes the following prescription(s): atorvastatin, citalopram, diclofenac, hydrochlorothiazide, mometasone, niacin, pantoprazole, topiramate, trazodone, and valacyclovir. She  reports that she has quit smoking. Her smoking use included cigarettes. She has never used smokeless tobacco. She reports that she does not use drugs. No history on file for alcohol use. Ms. PStanczykis allergic to codeine and penicillins.   HPI  Today, she is being contacted for a post-procedure assessment.   Post-procedure evaluation   Type: RIGHT Suprascapular nerve block #4  and RIGHT Deltoid TPI Laterality:  Right Level: Superior to scapular spine, lateral to supraspinatus fossa (Suprascapular notch).  Purpose: Therapeutic Date: 01/09/2022 Performed by: BGillis Santa MD Anesthesia: Local (1-2% Lidocaine)  Anxiolysis: IV Versed 2 mg   Sedation: Minimal  Guidance: Fluoroscopy          Indications: Shoulder pain, severe enough to impact quality of life and/or function.  1. Rotator cuff arthropathy of shoulder (Right)   2. Chronic shoulder pain (Right)   3. Pain in shoulder region after shoulder replacement (Right)    NAS-11 score:   Pre-procedure: 8/10   Post-procedure: 2/10       Effectiveness:  Initial hour after procedure: 0 %  Subsequent 4-6 hours  post-procedure:  0 %  Analgesia past initial 6 hours: 50 %  Ongoing improvement:  Analgesic:  50% Function: Claudia Brennan reports improvement in function ROM: Claudia Brennan reports improvement in ROM   Laboratory Chemistry Profile   Renal Lab Results  Component Value Date   BUN 18 12/24/2021   CREATININE 0.94 12/24/2021   GFRAA >60 12/22/2019   GFRNONAA >60 12/24/2021    Hepatic Lab Results  Component Value Date   AST 15 12/22/2019   ALT 17 12/22/2019   ALBUMIN 4.2 12/22/2019   ALKPHOS 79 12/22/2019    Electrolytes Lab Results  Component Value Date   NA 143 12/24/2021   K 3.4 (L) 12/24/2021   CL 106 12/24/2021   CALCIUM 10.1 12/24/2021   MG 2.1 12/24/2021    Bone No results found for: "VD25OH", "VD125OH2TOT", "DX8338SN0", "NL9767HA1", "25OHVITD1", "25OHVITD2", "25OHVITD3", "TESTOFREE", "TESTOSTERONE"  Inflammation (CRP: Acute Phase) (ESR: Chronic Phase) No results found for: "CRP", "ESRSEDRATE", "LATICACIDVEN"       Note: Above Lab results reviewed.   Assessment  The primary encounter diagnosis was Rotator cuff arthropathy of shoulder (Right). Diagnoses of Chronic shoulder pain (Right), Pain in shoulder region after shoulder replacement (Right), Rotator cuff arthropathy of both shoulders, and Chronic pain syndrome were also pertinent to this visit.  Plan of Care    Claudia Brennan has a current medication list which includes the following long-term medication(s): atorvastatin, citalopram, hydrochlorothiazide, niacin, pantoprazole, topiramate, and trazodone.  Pharmacotherapy (Medications Ordered): Meds ordered this encounter  Medications   diclofenac (VOLTAREN) 50 MG EC tablet    Sig: Take 1 tablet (50 mg total) by mouth 2 (two) times daily for 15 days.    Dispense:  30 tablet    Refill:  0   Orders:  No orders of the defined types were placed in this encounter.  Follow-up plan:   Return if symptoms worsen or fail to improve.    Recent Visits Date Type Provider Dept   02/06/22 Office Visit Gillis Santa, MD [facility]  01/09/22 Procedure visit Gillis Santa, MD [facility]  01/01/22 Office Visit Gillis Santa, MD [facility]  Showing recent visits within past 90 days and meeting all other requirements Today's Visits Date Type Provider Dept  02/25/22 Office Visit Gillis Santa, MD [facility]  Showing today's visits and meeting all other requirements Future Appointments No visits were found meeting these conditions. Showing future appointments within next 90 days and meeting all other requirements  I discussed the assessment and treatment plan with the patient. The patient was provided an opportunity to ask questions and all were answered. The patient agreed with the plan and demonstrated an understanding of the instructions.  Patient advised to call back or seek an in-person evaluation if the symptoms or condition worsens.  Duration of encounter: 64mnutes.  Note by: BGillis Santa MD Date: 02/25/2022; Time: 3:31 PM

## 2022-03-04 ENCOUNTER — Telehealth: Payer: Self-pay | Admitting: Student in an Organized Health Care Education/Training Program

## 2022-03-04 NOTE — Telephone Encounter (Signed)
PT stated that the medication diclofenac is causes her to have black stool and constipation. PT stated that she needs some other medications for the pain in her shoulder. PT stated that when she was taking the diclofenac before she had the same problems with black stool. PT stated that she has stop taking medications as of 03/02/22 Please give patient.

## 2022-03-05 NOTE — Telephone Encounter (Signed)
Patient instructed.
